# Patient Record
Sex: Female | Born: 1935 | Race: White | Hispanic: No | Smoking: Never smoker
Health system: Southern US, Community
[De-identification: ages and names within clinical notes are randomized; demographics above are authoritative.]

## PROBLEM LIST (undated history)

## (undated) DIAGNOSIS — I872 Venous insufficiency (chronic) (peripheral): Secondary | ICD-10-CM

## (undated) DIAGNOSIS — D649 Anemia, unspecified: Secondary | ICD-10-CM

## (undated) DIAGNOSIS — M5090 Cervical disc disorder, unspecified, unspecified cervical region: Secondary | ICD-10-CM

## (undated) DIAGNOSIS — E079 Disorder of thyroid, unspecified: Secondary | ICD-10-CM

## (undated) DIAGNOSIS — M810 Age-related osteoporosis without current pathological fracture: Secondary | ICD-10-CM

## (undated) DIAGNOSIS — E7800 Pure hypercholesterolemia, unspecified: Secondary | ICD-10-CM

## (undated) DIAGNOSIS — H409 Unspecified glaucoma: Secondary | ICD-10-CM

## (undated) HISTORY — DX: Hypercalcemia: E83.52

## (undated) HISTORY — DX: Disorder of thyroid, unspecified: E07.9

## (undated) HISTORY — DX: Anemia, unspecified: D64.9

## (undated) HISTORY — DX: Venous insufficiency (chronic) (peripheral): I87.2

## (undated) HISTORY — DX: Pure hypercholesterolemia, unspecified: E78.00

## (undated) HISTORY — DX: Unspecified glaucoma: H40.9

## (undated) HISTORY — DX: Cervical disc disorder, unspecified, unspecified cervical region: M50.90

## (undated) HISTORY — DX: Age-related osteoporosis without current pathological fracture: M81.0

---

## 1997-06-25 ENCOUNTER — Ambulatory Visit (HOSPITAL_COMMUNITY): Admission: RE | Admit: 1997-06-25 | Discharge: 1997-06-25 | Payer: Self-pay | Admitting: Internal Medicine

## 1997-07-30 ENCOUNTER — Ambulatory Visit (HOSPITAL_COMMUNITY): Admission: RE | Admit: 1997-07-30 | Discharge: 1997-07-30 | Payer: Self-pay | Admitting: Obstetrics & Gynecology

## 2000-01-05 ENCOUNTER — Encounter: Payer: Self-pay | Admitting: Internal Medicine

## 2000-01-05 ENCOUNTER — Encounter: Admission: RE | Admit: 2000-01-05 | Discharge: 2000-01-05 | Payer: Self-pay | Admitting: Internal Medicine

## 2001-01-21 ENCOUNTER — Encounter: Admission: RE | Admit: 2001-01-21 | Discharge: 2001-01-21 | Payer: Self-pay | Admitting: Internal Medicine

## 2001-01-21 ENCOUNTER — Encounter: Payer: Self-pay | Admitting: Internal Medicine

## 2001-02-01 ENCOUNTER — Ambulatory Visit (HOSPITAL_COMMUNITY): Admission: RE | Admit: 2001-02-01 | Discharge: 2001-02-01 | Payer: Self-pay | Admitting: Internal Medicine

## 2001-02-01 ENCOUNTER — Encounter: Payer: Self-pay | Admitting: Internal Medicine

## 2002-01-21 ENCOUNTER — Other Ambulatory Visit: Admission: RE | Admit: 2002-01-21 | Discharge: 2002-01-21 | Payer: Self-pay | Admitting: Internal Medicine

## 2002-01-23 ENCOUNTER — Encounter: Payer: Self-pay | Admitting: Internal Medicine

## 2002-01-23 ENCOUNTER — Encounter: Admission: RE | Admit: 2002-01-23 | Discharge: 2002-01-23 | Payer: Self-pay | Admitting: Internal Medicine

## 2002-02-03 ENCOUNTER — Encounter: Admission: RE | Admit: 2002-02-03 | Discharge: 2002-02-03 | Payer: Self-pay | Admitting: Internal Medicine

## 2002-02-03 ENCOUNTER — Encounter: Payer: Self-pay | Admitting: Internal Medicine

## 2003-01-26 ENCOUNTER — Encounter: Admission: RE | Admit: 2003-01-26 | Discharge: 2003-01-26 | Payer: Self-pay | Admitting: Internal Medicine

## 2003-01-26 ENCOUNTER — Encounter: Payer: Self-pay | Admitting: Internal Medicine

## 2004-01-28 ENCOUNTER — Encounter: Admission: RE | Admit: 2004-01-28 | Discharge: 2004-01-28 | Payer: Self-pay | Admitting: Internal Medicine

## 2005-02-10 ENCOUNTER — Encounter: Admission: RE | Admit: 2005-02-10 | Discharge: 2005-02-10 | Payer: Self-pay | Admitting: Internal Medicine

## 2005-02-26 ENCOUNTER — Observation Stay (HOSPITAL_COMMUNITY): Admission: EM | Admit: 2005-02-26 | Discharge: 2005-02-28 | Payer: Self-pay | Admitting: Emergency Medicine

## 2005-09-21 ENCOUNTER — Ambulatory Visit (HOSPITAL_COMMUNITY): Admission: RE | Admit: 2005-09-21 | Discharge: 2005-09-21 | Payer: Self-pay | Admitting: Cardiology

## 2005-10-13 ENCOUNTER — Encounter: Admission: RE | Admit: 2005-10-13 | Discharge: 2005-10-13 | Payer: Self-pay | Admitting: Internal Medicine

## 2006-02-12 ENCOUNTER — Encounter: Admission: RE | Admit: 2006-02-12 | Discharge: 2006-02-12 | Payer: Self-pay | Admitting: Internal Medicine

## 2007-02-14 ENCOUNTER — Encounter: Admission: RE | Admit: 2007-02-14 | Discharge: 2007-02-14 | Payer: Self-pay | Admitting: Geriatric Medicine

## 2007-08-21 ENCOUNTER — Emergency Department (HOSPITAL_COMMUNITY): Admission: EM | Admit: 2007-08-21 | Discharge: 2007-08-21 | Payer: Self-pay | Admitting: Emergency Medicine

## 2008-02-18 ENCOUNTER — Encounter: Admission: RE | Admit: 2008-02-18 | Discharge: 2008-02-18 | Payer: Self-pay | Admitting: Geriatric Medicine

## 2009-02-24 ENCOUNTER — Encounter: Admission: RE | Admit: 2009-02-24 | Discharge: 2009-02-24 | Payer: Self-pay | Admitting: Geriatric Medicine

## 2010-02-28 ENCOUNTER — Encounter: Admission: RE | Admit: 2010-02-28 | Discharge: 2010-02-28 | Payer: Self-pay | Admitting: Geriatric Medicine

## 2010-08-19 NOTE — Consult Note (Signed)
NAME:  Roberta [name], Roberta [name]               ACCOUNT NO.:  [id number]   MEDICAL RECORD NO.:  [id number]          PATIENT TYPE:  INP   LOCATION:  2010                         FACILITY:  [facility]   PHYSICIAN:  Armanda Magic, M.D.     DATE OF BIRTH:  1929/10/23   DATE OF CONSULTATION:  02/27/2005  DATE OF DISCHARGE:                                   CONSULTATION   REFERRING PHYSICIAN:  Ladell Pier, M.D.   CHIEF COMPLAINT:  Chest pain.   HISTORY OF PRESENT ILLNESS:  This is a 77 year old white female with no  previous history of coronary disease who presents with a five week history  of atypical chest pain associated with rest and exertion.  She describes it  as located over the left mid chest.  There was no radiation and no  associated symptoms of shortness of breath, nausea or vomiting or  diaphoresis.  She says it usually lasts less than 15 minutes at time.  She  states she walks regularly at least one mile a day.  She had one occasion  where chest pain occurred and the patient rested and the pain resolved but  she has had other times where she has had no chest pain while walking.  She  had chest pain on February 26, 2005 while here at the hospital for a  meeting.  Apparently appeared gray with chest pain and was sent to the  emergency room.  She has ruled out for myocardial infarction by serial  enzymes.  She says the frequency of pain occurs anywhere from 2-3 episodes a  day for the past 3-4 weeks.   PAST MEDICAL HISTORY:  Osteoporosis.   PAST SURGICAL HISTORY:  Cholecystectomy and total abdominal hysterectomy.   SOCIAL HISTORY:  She denies any tobacco or alcohol use.   FAMILY HISTORY:  Her father had an MI in his late 43's.  He died before he  was 41.  Her mother was healthy.   REVIEW OF SYSTEMS:  Otherwise stated in HPI is negative.   ALLERGIES:  None.   CURRENT MEDICATIONS:  Fosamax 70 mg weekly.  Vitamin D with calcium b.i.d.  Multivitamins and Vitamin E.   PHYSICAL  EXAMINATION:  GENERAL:  A well-developed, well-nourished white  female in no acute distress.  VITAL SIGNS:  Benign.  BP 104/66, pulse 74, respirations 18, she is  afebrile.  NECK:  Supple without lymphadenopathy.  Carotid upstroke is +2 bilaterally  without bruits.  LUNGS:  Clear to auscultation throughout.  HEART:  Regular rate and rhythm.  No murmur, rub, or gallop.  Normal S1 and  S2.  ABDOMEN:  Soft, nontender, nondistended, active bowel sounds, no  hepatosplenomegaly.  EXTREMITIES:  No cyanosis, erythema or edema, +1 posterior tibial pulses  bilaterally.   LABORATORY DATA:  Myoglobin 43.5, MB 1, troponin less than 0.05, CPK 44, MB  1.3, troponin 0.01.  Sodium 137, potassium 3.4, chloride 107, bicarbonate  26, BUN 12, creatinine 0.5, glucose 93, LFT's are normal.  Hematocrit 37.7,  hemoglobin 12.7, platelet count 259, white cell count 8.5, PT 12, INR 0.9.  EKG shows  sinus rhythm with no ST-T wave abnormalities.   ASSESSMENT:  Atypical chest pain in a patient with only cardiac risk factors  are age and family history.  Her EKG is nonischemic and cardiac enzymes are  negative.   PLAN:  Would recommend stress Cardiolite study in the morning to rule out  inducible ischemia.  She has already eaten this morning so we will order a  stress Cardiolite for in the morning.  Would recommend also checking a  fasting statin panel if not done in the past 12 months.  Would continue  aspirin and Lovenox for DVT prophylaxis.      Armanda Magic, M.D.  Electronically Signed     TT/MEDQ  D:  02/27/2005  T:  02/27/2005  Job:  [id number]   cc:   Ladell Pier, M.D.  Fax: [phone]

## 2010-08-19 NOTE — Discharge Summary (Signed)
NAME:  Roberta [name], Roberta [name]               ACCOUNT NO.:  [id number]   MEDICAL RECORD NO.:  [id number]          PATIENT TYPE:  INP   LOCATION:  2010                         FACILITY:  [facility]   PHYSICIAN:  Ladell Pier, M.D.   DATE OF BIRTH:  [birth year]   DATE OF ADMISSION:  02/26/2005  DATE OF DISCHARGE:  02/28/2005                                 DISCHARGE SUMMARY   DISCHARGE DIAGNOSES:  1.  Chest pain with negative Cardiolite stress test.  2.  Osteoporosis.   DISCHARGE MEDICATIONS:  1.  Calcium with vitamin D twice daily.  2.  Fosamax 70 mg weekly.  3.  Multivitamins daily.  4.  Aspirin 81 mg daily.   FOLLOW UP:  The patient to follow up in the office in two weeks.   OPERATION/PROCEDURE:  Cardiolite stress test.   CONSULTATIONS:  Cardiology.   HISTORY OF PRESENT ILLNESS:  The patient is a 77 year old white female with  past medical history significant for osteoporosis.  The patient came in with  five days of three to four episodes of chest pain.  Pain was located in the  left side of the chest without radiation or any associated symptoms.  The  patient presented to the emergency room for further evaluation.   Past medical history, medications, allergies, review of systems per  admission history and physical.   PHYSICAL EXAMINATION ON DISCHARGE:  VITAL SIGNS: Temperature 97.5, pulse 78,  respirations 20, blood pressure 93/55, pulse oximetry 98% on room air.  HEENT:  Normocephalic, atraumatic.  Pupils are equal, round and reactive to  light.  Throat without erythema.  CARDIOVASCULAR:  Regular rate and rhythm.  ABDOMEN:  Positive bowel sounds.  EXTREMITIES:  Without edema.   HOSPITAL COURSE:  1.  Chest pain.  The patient was admitted, placed on aspirin. Cardiology      consulted and she had a Cardiolite stress test done that was normal.  2.  Osteoporosis.  Her Fosamax was held while she was an inpatient.   DISCHARGE LABORATORY DATA:  WBC 7.8, hemoglobin 11.8, platelets 238, MCV  90.2.  Sodium 140, potassium 3.7, chloride 110, glucose 85, BUN 14, calcium  10.4, creatinine 0.9.  Cardiac enzymes negative x3.  Total cholesterol 329.  LDL 144.  HDL 66. Triglycerides 95.      Ladell Pier, M.D.  Electronically Signed     NJ/MEDQ  D:  04/21/2005  T:  04/21/2005  Job:  [id number]

## 2010-08-19 NOTE — H&P (Signed)
NAME:  Roberta [name], Roberta [name] NO.:  [id number]   MEDICAL RECORD NO.:  [id number]          PATIENT TYPE:  INP   LOCATION:  2010                         FACILITY:  [facility]   PHYSICIAN:  Theressa Millard, M.D.    DATE OF BIRTH:  May 12, 1929   DATE OF ADMISSION:  02/26/2005  DATE OF DISCHARGE:                                HISTORY & PHYSICAL   HISTORY OF PRESENT ILLNESS:  Roberta [name] is brought in for observation because  of chest pain.   For the past 5 weeks, she has 3-4 episodes of chest discomfort per day,  usually occurring about 3 days per week.  The chest pain is a left upper  chest discomfort without radiation or any associated symptoms.  Today, she  had 2 spells while she was in the hospital at a meeting.  She looked  somewhat gray and was brought to the emergency department.  She rated the  pain as a 3-4/10.   In terms of cardiac risk factors, she has a positive family history, but no  history of hypertension, diabetes, or hypercholesterolemia.  She does not  smoke.   PAST MEDICAL HISTORY:   SURGICAL:  1.  Cholecystectomy.  2.  Hysterectomy for benign reasons.   MEDICAL:  Osteoporosis.  Otherwise, negative.   MEDICATIONS:  1.  Fosamax 70 mg weekly.  2.  Vitamin D plus calcium tablets twice daily.  3.  Multivitamin daily.  4.  Vitamin E daily.   SOCIAL HISTORY:  Does not smoke.  Does not consume alcohol.  She is widowed.  She lives alone.   FAMILY HISTORY:  Father died of heart disease that started at less than age  30.   REVIEW OF SYSTEMS:  Otherwise negative.   PHYSICAL EXAMINATION:  VITAL SIGNS:  Blood pressure 145/85, pulse 77.  GENERAL:  She is comfortable.  SKIN:  Warm and dry.  HEENT:  Pupils equal, round and reactive to light.  Extraocular movements  are intact.  Funduscopic exam is unremarkable.  Ears, nose, and throat are  unremarkable.  NECK:  Supple.  Thyroid is not enlarged and tender.  CHEST:  Clear to auscultation and percussion.  CARDIAC:  Normal S1 and S2 without an S3, S4, murmur, rub, or click.  ABDOMEN:  Soft and nontender with normal bowel sounds without  hepatosplenomegaly or mass.  EXTREMITIES:  Without clubbing, cyanosis, or edema.   LABORATORY DATA:  Hemoglobin 12.7, white count 8500.  Electrolytes normal.  Troponin negative.  Myoglobin negative.   EKG shows normal sinus rhythm with a normal EKG.   IMPRESSION:  Chest discomfort.  This is certainly atypical for coronary  artery disease, but could be unstable angina.  Will place her on observation  status and ask for a cardiology consult in the morning.      Theressa Millard, M.D.  Electronically Signed     JO/MEDQ  D:  02/26/2005  T:  02/27/2005  Job:  [id number]

## 2010-08-19 NOTE — Discharge Summary (Signed)
NAME:  Roberta [name], Roberta [name]               ACCOUNT NO.:  [id number]   MEDICAL RECORD NO.:  [id number]          PATIENT TYPE:  INP   LOCATION:  2010                         FACILITY:  [facility]   PHYSICIAN:  Ladell Pier, M.D.   DATE OF BIRTH:  07-Jan-1930   DATE OF ADMISSION:  02/26/2005  DATE OF DISCHARGE:  02/28/2005                                 DISCHARGE SUMMARY   DISCHARGE DIAGNOSES:  1.  Chest pain and negative Cardiolite stress tests, status post      cholecystectomy.  2.  Hysterectomy for benign reasons.  3.  Osteoporosis.   DISCHARGE MEDICATIONS:  1.  Fosamax 70 mg weekly.  2.  Vitamin D Plus Calcium twice daily.  3.  Multivitamin daily.  4.  Vitamin E daily.   CONSULTATIONS:  Cardiology, [place], Dr. Mayford Knife.   PROCEDURES:  Sets of Cardiolites.  The first test was negative.   FOLLOW UP:  He is in follow-up with Dr. Olena Leatherwood in two weeks.   HISTORY OF PRESENT ILLNESS:  The patient is a female with no significant  past medical history except for osteoporosis and a family history of heart  disease that presented to the emergency room with chest pain that has been  going on for the past five weeks.  She was admitted for unstable angina for  further workup.   Past medical history, family history, social history, medications,  allergies, and review of systems per admission H&P.   PHYSICAL EXAMINATION:  VITAL SIGNS:  Temperature 97, pulse 75, respirations  30, blood pressure 110/60, pulse oximetry 60% on room.  HEENT:  Head is normocephalic, atraumatic.  Pupils equal, round, and  reactive to light,  Throat without erythema.  CARDIOVASCULAR:  Regular rate and rhythm.  LUNGS:  Clear bilaterally.  ABDOMEN:  Positive bowel sounds.  EXTREMITIES:  Without edema.   HOSPITAL COURSE:  1.  Underlying chest pain.  The patient was admitted to the telemetry bed.      Cardiac enzymes were all negative.  EKG had no acute ST segment      elevation or depression.  She had a profusion  Cardiolite stress test      done that showed normal myocardial profusion without evidence of infarct      and she had normal ejection fraction estimated at 74%.  The patient was      discharged with follow-up in the office.  Our chest __________is      noncardiac and most likely noncardiac in etiology.   LABORATORY DATA:  Discharge labs:  Total cholesterol 229, triglycerides 95,  HDL 66, LDL 144.  BMP:  Sodium 140, potassium 4.1, chloride 109, CO2 24,  glucose 78, BUN 21, creatinine 0.9, calcium 10.4. CBC:  WBC 7.8, hemoglobin  11.8, MCV 90, platelets 238.  See this also at the discharge.  Problem with  mild anemia.      Ladell Pier, M.D.  Electronically Signed     NJ/MEDQ  D:  03/01/2005  T:  03/01/2005  Job:  [id number]

## 2010-08-19 NOTE — Cardiovascular Report (Signed)
NAME:  Roberta [name], Roberta [name]               ACCOUNT NO.:  [id number]   MEDICAL RECORD NO.:  [id number]          PATIENT TYPE:  OIB   LOCATION:  2853                         FACILITY:  [facility]   PHYSICIAN:  Armanda Magic, M.D.     DATE OF BIRTH:  January 28, 1930   DATE OF PROCEDURE:  09/21/2005  DATE OF DISCHARGE:                              CARDIAC CATHETERIZATION   REFERRING PHYSICIAN:  Dr. Olena Leatherwood.   PROCEDURE:  Left heart catheterization, coronary angiography, left  ventriculography.   OPERATOR:  Armanda Magic, M.D.   INDICATIONS:  Chest pain.   COMPLICATIONS:  None.   INTRAVENOUS ACCESS:  Via right femoral artery 6-French sheath.   HISTORY:  This is a 77 year old white female who has a history of chest pain  and underwent stress Cardiolite study in November which showed no inducible  ischemia but recently has developed more chest pain, intermittent with  exertion and now presents for cardiac catheterization.   The patient was brought to cardiac catheterization laboratory in a fasting  nonsedated state.  Informed consent was obtained.  The patient was connected  to continuous heart rate and pulse oximetry monitoring and intermittent  blood pressure monitoring.  The right groin was prepped and draped in a  sterile fashion.  One percent Xylocaine was used for local anesthesia.  Using a modified Seldinger technique, a 6-French sheath was placed in the  right femoral artery.  Under fluoroscopic guidance, a 6-French JL-4 catheter  was placed in the left coronary artery.  Multiple cine films were taken in  30-degree RAO and 40-degree LAO views.  Catheter was exchanged over a  guidewire for a 6-French JR-4 catheter which was placed under fluoroscopic  guidance in the right coronary artery.  Multiple cine films were taken in  the 30-degree RAO and 40-degree LAO views.  This catheter was then exchanged  out over a guidewire for a 6-French angled pigtail catheter which was placed  under  fluoroscopic guidance in the left ventricular cavity.  Left  ventriculography was performed in 30-degree RAO view using a total of 30 mL  of contrast at 15 mL/second.  The catheter was then pulled back across the  aortic valve with no significant gradient noted.  At the end of procedure,  all catheters and sheaths were removed.  Manual compression was performed  until adequate hemostasis was obtained.  The patient was transferred back to  room in stable condition.   RESULTS:  Left main coronary is widely patent and trifurcates into a left  anterior descending artery, ramus branch and left circumflex.  Left anterior  descending artery is widely patent in its proximal portion giving rise two  diagonal branches, both of which are widely patent and both of which  bifurcated into daughter vessels.  Just distal to the takeoff of the second  diagonal, there was a 40-50% mid LAD narrowing.  The LAD then traverses the  apex and is widely patent throughout the rest of its course.   The ramus is a moderate sized vessel and is widely patent.   The left circumflex is widely patent throughout its  course in the AV groove  giving rise to a very small obtuse marginal 1 branch and then a large obtuse  marginal 2 branch which is widely patent.   The right coronary is widely patent throughout its course and bifurcates  distally into posterior lateral artery and posterior descending artery.  Posterior descending artery is widely patent.  The posterior lateral artery  has a proximal 20-30% narrowing.   Left ventriculography shows normal LV systolic function, EF 65%.  Left  ventricular pressure was 133/5 mmHg, aortic pressure 140/73 mmHg.   ASSESSMENT:  1.  Noncardiac chest pain.  2.  Nonobstructive coronary disease with borderline mid left anterior      descending artery.  3.  Normal left ventricular function.   PLAN:  1.  Discharge home after IV fluid and bedrest.  2.  Outpatient adenosine  Cardiolite to rule out anterior ischemia.  Her last      Cardiolite was more than 6 months ago.  3.  Follow up with me in 2 to 3 weeks.      Armanda Magic, M.D.  Electronically Signed     TT/MEDQ  D:  09/21/2005  T:  09/21/2005  Job:  [id number]   cc:   Olena Leatherwood, M.D.

## 2011-01-24 ENCOUNTER — Other Ambulatory Visit: Payer: Self-pay | Admitting: Geriatric Medicine

## 2011-01-24 DIAGNOSIS — Z1231 Encounter for screening mammogram for malignant neoplasm of breast: Secondary | ICD-10-CM

## 2011-03-02 ENCOUNTER — Ambulatory Visit: Payer: Self-pay

## 2011-03-02 ENCOUNTER — Ambulatory Visit
Admission: RE | Admit: 2011-03-02 | Discharge: 2011-03-02 | Disposition: A | Discharge status: Home / Self Care | Payer: Medicare Other | Source: Ambulatory Visit | Attending: Geriatric Medicine | Admitting: Geriatric Medicine

## 2011-03-02 DIAGNOSIS — Z1231 Encounter for screening mammogram for malignant neoplasm of breast: Secondary | ICD-10-CM

## 2011-09-26 ENCOUNTER — Other Ambulatory Visit: Payer: Self-pay | Admitting: Gastroenterology

## 2011-09-26 DIAGNOSIS — D649 Anemia, unspecified: Secondary | ICD-10-CM

## 2011-09-26 DIAGNOSIS — K5790 Diverticulosis of intestine, part unspecified, without perforation or abscess without bleeding: Secondary | ICD-10-CM

## 2011-09-27 ENCOUNTER — Ambulatory Visit
Admission: RE | Admit: 2011-09-27 | Discharge: 2011-09-27 | Disposition: A | Discharge status: Home / Self Care | Payer: Medicare Other | Source: Ambulatory Visit | Attending: Gastroenterology | Admitting: Gastroenterology

## 2011-09-27 DIAGNOSIS — D649 Anemia, unspecified: Secondary | ICD-10-CM

## 2011-09-27 DIAGNOSIS — K5790 Diverticulosis of intestine, part unspecified, without perforation or abscess without bleeding: Secondary | ICD-10-CM

## 2012-01-22 ENCOUNTER — Other Ambulatory Visit: Payer: Self-pay | Admitting: Geriatric Medicine

## 2012-01-22 DIAGNOSIS — Z1231 Encounter for screening mammogram for malignant neoplasm of breast: Secondary | ICD-10-CM

## 2012-03-04 ENCOUNTER — Ambulatory Visit
Admission: RE | Admit: 2012-03-04 | Discharge: 2012-03-04 | Disposition: A | Discharge status: Home / Self Care | Payer: Medicare Other | Source: Ambulatory Visit | Attending: Geriatric Medicine | Admitting: Geriatric Medicine

## 2012-03-04 DIAGNOSIS — Z1231 Encounter for screening mammogram for malignant neoplasm of breast: Secondary | ICD-10-CM

## 2013-01-28 ENCOUNTER — Other Ambulatory Visit: Payer: Self-pay

## 2013-01-28 DIAGNOSIS — Z1231 Encounter for screening mammogram for malignant neoplasm of breast: Secondary | ICD-10-CM

## 2013-03-06 ENCOUNTER — Ambulatory Visit
Admission: RE | Admit: 2013-03-06 | Discharge: 2013-03-06 | Disposition: A | Discharge status: Home / Self Care | Payer: Medicare Other | Source: Ambulatory Visit

## 2013-03-06 DIAGNOSIS — Z1231 Encounter for screening mammogram for malignant neoplasm of breast: Secondary | ICD-10-CM

## 2013-05-04 DIAGNOSIS — M810 Age-related osteoporosis without current pathological fracture: Secondary | ICD-10-CM

## 2013-05-04 HISTORY — DX: Age-related osteoporosis without current pathological fracture: M81.0

## 2014-01-01 ENCOUNTER — Encounter: Payer: Self-pay | Admitting: General Surgery

## 2014-01-01 DIAGNOSIS — E7800 Pure hypercholesterolemia, unspecified: Secondary | ICD-10-CM | POA: Insufficient documentation

## 2014-02-06 ENCOUNTER — Other Ambulatory Visit: Payer: Self-pay

## 2014-02-06 DIAGNOSIS — Z1231 Encounter for screening mammogram for malignant neoplasm of breast: Secondary | ICD-10-CM

## 2014-03-09 ENCOUNTER — Ambulatory Visit: Payer: Medicare Other

## 2014-03-12 ENCOUNTER — Ambulatory Visit
Admission: RE | Admit: 2014-03-12 | Discharge: 2014-03-12 | Disposition: A | Discharge status: Home / Self Care | Payer: Medicare Other | Source: Ambulatory Visit

## 2014-03-12 DIAGNOSIS — Z1231 Encounter for screening mammogram for malignant neoplasm of breast: Secondary | ICD-10-CM

## 2015-02-09 ENCOUNTER — Other Ambulatory Visit: Payer: Self-pay

## 2015-02-09 DIAGNOSIS — Z1231 Encounter for screening mammogram for malignant neoplasm of breast: Secondary | ICD-10-CM

## 2015-03-15 ENCOUNTER — Ambulatory Visit: Payer: Self-pay

## 2015-03-18 ENCOUNTER — Ambulatory Visit
Admission: RE | Admit: 2015-03-18 | Discharge: 2015-03-18 | Disposition: A | Discharge status: Home / Self Care | Payer: Medicare Other | Source: Ambulatory Visit

## 2015-03-18 DIAGNOSIS — Z1231 Encounter for screening mammogram for malignant neoplasm of breast: Secondary | ICD-10-CM

## 2015-03-31 ENCOUNTER — Ambulatory Visit: Payer: Self-pay

## 2015-11-15 ENCOUNTER — Other Ambulatory Visit: Payer: Self-pay | Admitting: Geriatric Medicine

## 2015-11-15 DIAGNOSIS — M79662 Pain in left lower leg: Secondary | ICD-10-CM

## 2015-11-18 ENCOUNTER — Ambulatory Visit
Admission: RE | Admit: 2015-11-18 | Discharge: 2015-11-18 | Disposition: A | Discharge status: Home / Self Care | Payer: Medicare Other | Source: Ambulatory Visit | Attending: Geriatric Medicine | Admitting: Geriatric Medicine

## 2015-11-18 DIAGNOSIS — M79662 Pain in left lower leg: Secondary | ICD-10-CM

## 2015-11-24 ENCOUNTER — Other Ambulatory Visit: Payer: Self-pay | Admitting: Geriatric Medicine

## 2015-11-24 DIAGNOSIS — I82612 Acute embolism and thrombosis of superficial veins of left upper extremity: Secondary | ICD-10-CM

## 2015-11-29 ENCOUNTER — Ambulatory Visit
Admission: RE | Admit: 2015-11-29 | Discharge: 2015-11-29 | Disposition: A | Discharge status: Home / Self Care | Payer: Medicare Other | Source: Ambulatory Visit | Attending: Geriatric Medicine | Admitting: Geriatric Medicine

## 2015-11-29 ENCOUNTER — Other Ambulatory Visit: Payer: Medicare Other

## 2015-11-29 DIAGNOSIS — I82612 Acute embolism and thrombosis of superficial veins of left upper extremity: Secondary | ICD-10-CM

## 2016-02-17 ENCOUNTER — Other Ambulatory Visit: Payer: Self-pay | Admitting: Geriatric Medicine

## 2016-02-17 DIAGNOSIS — Z1231 Encounter for screening mammogram for malignant neoplasm of breast: Secondary | ICD-10-CM

## 2016-03-20 ENCOUNTER — Ambulatory Visit
Admission: RE | Admit: 2016-03-20 | Discharge: 2016-03-20 | Disposition: A | Discharge status: Home / Self Care | Payer: Medicare Other | Source: Ambulatory Visit | Attending: Geriatric Medicine | Admitting: Geriatric Medicine

## 2016-03-20 DIAGNOSIS — Z1231 Encounter for screening mammogram for malignant neoplasm of breast: Secondary | ICD-10-CM

## 2017-02-08 ENCOUNTER — Other Ambulatory Visit: Payer: Self-pay | Admitting: Geriatric Medicine

## 2017-02-08 DIAGNOSIS — Z1231 Encounter for screening mammogram for malignant neoplasm of breast: Secondary | ICD-10-CM

## 2017-03-21 ENCOUNTER — Ambulatory Visit
Admission: RE | Admit: 2017-03-21 | Discharge: 2017-03-21 | Disposition: A | Discharge status: Home / Self Care | Payer: Medicare Other | Source: Ambulatory Visit | Attending: Geriatric Medicine | Admitting: Geriatric Medicine

## 2017-03-21 DIAGNOSIS — Z1231 Encounter for screening mammogram for malignant neoplasm of breast: Secondary | ICD-10-CM

## 2018-02-08 ENCOUNTER — Other Ambulatory Visit: Payer: Self-pay | Admitting: Geriatric Medicine

## 2018-02-08 DIAGNOSIS — Z1231 Encounter for screening mammogram for malignant neoplasm of breast: Secondary | ICD-10-CM

## 2018-03-22 ENCOUNTER — Ambulatory Visit
Admission: RE | Admit: 2018-03-22 | Discharge: 2018-03-22 | Disposition: A | Discharge status: Home / Self Care | Payer: Medicare Other | Source: Ambulatory Visit | Attending: Geriatric Medicine | Admitting: Geriatric Medicine

## 2018-03-22 DIAGNOSIS — Z1231 Encounter for screening mammogram for malignant neoplasm of breast: Secondary | ICD-10-CM

## 2018-05-27 DIAGNOSIS — M810 Age-related osteoporosis without current pathological fracture: Secondary | ICD-10-CM | POA: Diagnosis not present

## 2018-07-31 DIAGNOSIS — G8929 Other chronic pain: Secondary | ICD-10-CM | POA: Diagnosis not present

## 2018-07-31 DIAGNOSIS — E213 Hyperparathyroidism, unspecified: Secondary | ICD-10-CM | POA: Diagnosis not present

## 2018-07-31 DIAGNOSIS — M25562 Pain in left knee: Secondary | ICD-10-CM | POA: Diagnosis not present

## 2018-07-31 DIAGNOSIS — M25561 Pain in right knee: Secondary | ICD-10-CM | POA: Diagnosis not present

## 2018-08-29 ENCOUNTER — Other Ambulatory Visit: Payer: Self-pay | Admitting: *Deleted

## 2018-08-29 NOTE — Patient Outreach (Signed)
Late entry- 07/01/18  Telephone call to patient for follow up on [employer] Member Health Questionnaire.  Spoke with pt, HIPAA verified, explained program, pt states she has no needs, per [employer] pt to be placed into engagement program.  [facility] mailed welcome letter to pt home with 24 hour nurse line magnet and pamphlet.  PLAN Outreach pt in 6 months  Irving Shows [facility], BSN [facility] Care Coordinator [phone]

## 2018-09-25 ENCOUNTER — Other Ambulatory Visit: Payer: Self-pay | Admitting: *Deleted

## 2018-09-25 NOTE — Patient Outreach (Signed)
Case closed, pt enrolled in external program [employer], RN CM faxed case closure letter to primary care provider.  Case closed  Tawn Fitzner RNC, BSN [facility] Care Coordinator [phone]   

## 2018-10-15 DIAGNOSIS — Z79899 Other long term (current) drug therapy: Secondary | ICD-10-CM | POA: Diagnosis not present

## 2018-10-15 DIAGNOSIS — R269 Unspecified abnormalities of gait and mobility: Secondary | ICD-10-CM | POA: Diagnosis not present

## 2018-10-15 DIAGNOSIS — M810 Age-related osteoporosis without current pathological fracture: Secondary | ICD-10-CM | POA: Diagnosis not present

## 2018-10-15 DIAGNOSIS — Z0000 Encounter for general adult medical examination without abnormal findings: Secondary | ICD-10-CM | POA: Diagnosis not present

## 2018-10-15 DIAGNOSIS — Z1389 Encounter for screening for other disorder: Secondary | ICD-10-CM | POA: Diagnosis not present

## 2018-10-15 DIAGNOSIS — E7800 Pure hypercholesterolemia, unspecified: Secondary | ICD-10-CM | POA: Diagnosis not present

## 2018-10-15 DIAGNOSIS — E213 Hyperparathyroidism, unspecified: Secondary | ICD-10-CM | POA: Diagnosis not present

## 2018-11-14 DIAGNOSIS — M810 Age-related osteoporosis without current pathological fracture: Secondary | ICD-10-CM | POA: Diagnosis not present

## 2018-11-14 DIAGNOSIS — D649 Anemia, unspecified: Secondary | ICD-10-CM | POA: Diagnosis not present

## 2019-01-01 ENCOUNTER — Ambulatory Visit: Payer: Medicare Other | Admitting: *Deleted

## 2019-01-29 DIAGNOSIS — M79605 Pain in left leg: Secondary | ICD-10-CM | POA: Diagnosis not present

## 2019-02-11 ENCOUNTER — Other Ambulatory Visit: Payer: Self-pay | Admitting: Geriatric Medicine

## 2019-02-11 DIAGNOSIS — Z1231 Encounter for screening mammogram for malignant neoplasm of breast: Secondary | ICD-10-CM

## 2019-04-03 ENCOUNTER — Ambulatory Visit: Payer: Medicare Other

## 2019-04-09 ENCOUNTER — Other Ambulatory Visit: Payer: Self-pay

## 2019-04-09 ENCOUNTER — Ambulatory Visit
Admission: RE | Admit: 2019-04-09 | Discharge: 2019-04-09 | Disposition: A | Discharge status: Home / Self Care | Payer: PPO | Source: Ambulatory Visit | Attending: Geriatric Medicine | Admitting: Geriatric Medicine

## 2019-04-09 DIAGNOSIS — Z1231 Encounter for screening mammogram for malignant neoplasm of breast: Secondary | ICD-10-CM | POA: Diagnosis not present

## 2019-04-24 ENCOUNTER — Ambulatory Visit: Payer: PPO | Attending: Internal Medicine

## 2019-04-24 DIAGNOSIS — Z23 Encounter for immunization: Secondary | ICD-10-CM

## 2019-04-24 NOTE — Progress Notes (Signed)
   Covid-19 Vaccination Clinic  Name:  Roberta [name]    MRN: [id number] DOB: 05-07-1929  04/24/2019  Roberta [name] was observed post Covid-19 immunization for 15 minutes without incidence. She was provided with Vaccine Information Sheet and instruction to access the V-Safe system.   Roberta [name] was instructed to call 911 with any severe reactions post vaccine: Marland Kitchen Difficulty breathing  . Swelling of your face and throat  . A fast heartbeat  . A bad rash all over your body  . Dizziness and weakness    Immunizations Administered    Name Date Dose VIS Date Route   Pfizer COVID-19 Vaccine 04/24/2019  2:53 PM 0.3 mL 03/14/2019 Intramuscular   Manufacturer: [employer], [employer]   Lot: [id number]   NDC: [address]-2

## 2019-05-15 ENCOUNTER — Ambulatory Visit: Payer: PPO | Attending: Internal Medicine

## 2019-05-15 DIAGNOSIS — Z23 Encounter for immunization: Secondary | ICD-10-CM | POA: Insufficient documentation

## 2019-05-15 NOTE — Progress Notes (Signed)
   Covid-19 Vaccination Clinic  Name:  JANAISHA TOLSMA    MRN: [id number] DOB: 07-23-1929  05/15/2019  Ms. Mudgett was observed post Covid-19 immunization for 15 minutes without incidence. She was provided with Vaccine Information Sheet and instruction to access the V-Safe system.   Ms. Stenseth was instructed to call 911 with any severe reactions post vaccine: Marland Kitchen Difficulty breathing  . Swelling of your face and throat  . A fast heartbeat  . A bad rash all over your body  . Dizziness and weakness    Immunizations Administered    Name Date Dose VIS Date Route   Pfizer COVID-19 Vaccine 05/15/2019  4:36 PM 0.3 mL 03/14/2019 Intramuscular   Manufacturer: [employer], [employer]   Lot: [id number]   NDC: [address]-7

## 2019-05-28 DIAGNOSIS — M810 Age-related osteoporosis without current pathological fracture: Secondary | ICD-10-CM | POA: Diagnosis not present

## 2019-10-17 DIAGNOSIS — Z79899 Other long term (current) drug therapy: Secondary | ICD-10-CM | POA: Diagnosis not present

## 2019-10-17 DIAGNOSIS — Z1389 Encounter for screening for other disorder: Secondary | ICD-10-CM | POA: Diagnosis not present

## 2019-10-17 DIAGNOSIS — M810 Age-related osteoporosis without current pathological fracture: Secondary | ICD-10-CM | POA: Diagnosis not present

## 2019-10-17 DIAGNOSIS — E213 Hyperparathyroidism, unspecified: Secondary | ICD-10-CM | POA: Diagnosis not present

## 2019-10-17 DIAGNOSIS — E7800 Pure hypercholesterolemia, unspecified: Secondary | ICD-10-CM | POA: Diagnosis not present

## 2019-10-17 DIAGNOSIS — Z0000 Encounter for general adult medical examination without abnormal findings: Secondary | ICD-10-CM | POA: Diagnosis not present

## 2019-11-21 DIAGNOSIS — M810 Age-related osteoporosis without current pathological fracture: Secondary | ICD-10-CM | POA: Diagnosis not present

## 2020-02-12 DIAGNOSIS — H26492 Other secondary cataract, left eye: Secondary | ICD-10-CM | POA: Diagnosis not present

## 2020-02-12 DIAGNOSIS — H35373 Puckering of macula, bilateral: Secondary | ICD-10-CM | POA: Diagnosis not present

## 2020-03-05 DIAGNOSIS — M7062 Trochanteric bursitis, left hip: Secondary | ICD-10-CM | POA: Diagnosis not present

## 2020-03-09 DIAGNOSIS — M25552 Pain in left hip: Secondary | ICD-10-CM | POA: Diagnosis not present

## 2020-03-17 DIAGNOSIS — R42 Dizziness and giddiness: Secondary | ICD-10-CM | POA: Diagnosis not present

## 2020-03-19 DIAGNOSIS — H8110 Benign paroxysmal vertigo, unspecified ear: Secondary | ICD-10-CM | POA: Diagnosis not present

## 2020-03-24 ENCOUNTER — Other Ambulatory Visit: Payer: Self-pay

## 2020-03-24 ENCOUNTER — Ambulatory Visit: Payer: PPO | Attending: Geriatric Medicine

## 2020-03-24 VITALS — BP 136/74

## 2020-03-24 DIAGNOSIS — R42 Dizziness and giddiness: Secondary | ICD-10-CM | POA: Insufficient documentation

## 2020-03-24 DIAGNOSIS — H8112 Benign paroxysmal vertigo, left ear: Secondary | ICD-10-CM | POA: Diagnosis not present

## 2020-03-24 NOTE — Therapy (Signed)
[facility] [address] [place], [place], [address] Phone: [phone]   Fax:  [phone]  Physical Therapy Evaluation  Patient Details  Name: Roberta [name] MRN: [id number] Date of Birth: Apr 26, 1929 Referring Provider (PT): [name], MD   Encounter Date: 03/24/2020   PT End of Session - 03/24/20 1228    Visit Number 1    Number of Visits 2    Date for PT Re-Evaluation 32/35/57   re-cert at follow up visit   Authorization Type HTA    PT Start Time 0932    PT Stop Time 1015    PT Time Calculation (min) 43 min    Activity Tolerance Patient tolerated treatment well    Behavior During Therapy [facility] for tasks assessed/performed           Past Medical History:  Diagnosis Date  . Anemia   . Cervical disc disease   . Glaucoma   . Hypercalcemia   . Hypercholesteremia   . Osteoporosis 2/15   Started Fosamax  . Thyroid disease    Hyperparathyroid  . Venous insufficiency     History reviewed. No pertinent surgical history.  Vitals:   03/24/20 1326  BP: 136/74      Subjective Assessment - 03/24/20 0936    Subjective Patient reports that the dizziness started approx two weeks ago, reports she stood up and fell backwards into the couch about 3 times. Patient repotrs few other episodes, but no episodes since starting the meclizine. Reports no dizziness laying back in bed. Patient reports that has dizziness when looking downward. Patient using a SBQC with ambulation.    Patient is accompained by: Family member   Daughter   Pertinent History Osteoporosis, Anemia, Glaucoma, Hypercholesteremia, Thyroid Disease, Venous Insufficiency    Limitations Walking    Patient Stated Goals Get rid of dizziness; get back how she was prior    Currently in Pain? No/denies              [facility] PT Assessment - 03/24/20 0001      Assessment   Medical Diagnosis BPPV/Dizziness    Referring Provider (PT) [name], MD     Hand Dominance Right      Precautions   Precautions Fall      Balance Screen   Has the patient fallen in the past 6 months No    Has the patient had a decrease in activity level because of a fear of falling?  No    Is the patient reluctant to leave their home because of a fear of falling?  No      Home Environment   Living Environment Private residence    Living Arrangements Alone    Available Help at Discharge Family    Type of Home Apartment    Home Access Level entry    Home Layout One level    Home Equipment Carman - quad      Prior Function   Level of Independence Independent    Vocation Retired      IT consultant   Overall Cognitive Status Within Functional Limits for tasks assessed      Observation/Other Assessments   Focus on Therapeutic Outcomes (FOTO)  53    Other Surveys  Dizziness Handicap Inventory (DHI)    Dizziness Handicap Inventory (DHI)  20/100      Sensation   Light Touch Appears Intact      Transfers   Transfers Sit to Stand;Stand to Sit  Sit to Stand 6: Modified independent (Device/Increase time)    Stand to Sit 6: Modified independent (Device/Increase time)      Ambulation/Gait   Ambulation/Gait Yes    Ambulation/Gait Assistance 5: Supervision    Ambulation/Gait Assistance Details ambulation into/out of therapy session with [facility]    Ambulation Distance (Feet) --   clinic distance   Assistive device Small based quad cane    Ambulation Surface Level;Indoor                  Vestibular Assessment - 03/24/20 0001      Symptom Behavior   Subjective history of current problem patient reports approx two weeks ago increased dizziness when went to get off couch, fell back into couch. Reports spinning sensation. has not had many episodes since beginning meclizine. Did take meclizine this morning.    Type of Dizziness  Spinning;Lightheadedness    Frequency of Dizziness with specific movements (look up/down)    Duration of Dizziness few minutes     Symptom Nature Positional;Motion provoked    Aggravating Factors Forward bending    Relieving Factors Rest    Progression of Symptoms Better      Oculomotor Exam   Oculomotor Alignment Normal    Ocular ROM WNL    Spontaneous Absent    Gaze-induced  Left beating nystagmus with L gaze    Smooth Pursuits Intact    Saccades Intact      Vestibulo-Ocular Reflex   VOR 1 Head Only (x 1 viewing) no dizziness    VOR to Slow Head Movement Normal      Positional Testing   Dix-Hallpike Dix-Hallpike Right;Dix-Hallpike Left      Dix-Hallpike Right   Dix-Hallpike Right Duration None    Dix-Hallpike Right Symptoms No nystagmus      Dix-Hallpike Left   Dix-Hallpike Left Duration 15 secs    Dix-Hallpike Left Symptoms Upbeat, left rotatory nystagmus              Objective measurements completed on examination: See above findings.        Vestibular Treatment/Exercise - 03/24/20 0001      Vestibular Treatment/Exercise   Vestibular Treatment Provided Canalith Repositioning    Canalith Repositioning Epley Manuever Left       EPLEY MANUEVER LEFT   Number of Reps  2    Overall Response  Symptoms Resolved     RESPONSE DETAILS LEFT resolved symptoms after 2 reps                 PT Education - 03/24/20 1228    Education Details Educated on BPPV; POC/Evaluation Findings    Person(s) Educated Patient;Child(ren)    Methods Explanation    Comprehension Verbalized understanding            PT Short Term Goals - 03/24/20 1323      PT SHORT TERM GOAL #1   Title = LTGs             PT Long Term Goals - 03/24/20 1324      PT LONG TERM GOAL #1   Title Patient will improve DHI to </= 10 to demonstrate improvements in symptoms and improve QoL    Baseline 20/100    Time 2    Period Weeks    Status New    Target Date 04/07/20      PT LONG TERM GOAL #2   Title Patient will demosntrate no symptoms with positional testing or bed mobility to demonstrate  resolution of  BPPV    Baseline nystagmus/vertigo with L Gilberto Better    Time 2    Period Weeks    Status New    Target Date 04/07/20                  Plan - 03/24/20 1326    Clinical Impression Statement Patient is a 86 y.o. female that was referred to Neuro OPPT service for Vertigo/BPPV. Patient's PMH significant for the following: Osteoporosis, Anemia, Glaucoma, Hypercholesteremia, Thyroid Disease, Venous Insufficiency. Upon evaluation patient presents with vertigo and L Upbeating Rotary Nystagmus  of short duration with L Dix Hallpike indicative of L Posterior Canal Canalithasis. Completed epley x 2 reps with resolution of symptoms and nytagmus after completion. Patient scored 20/100 on DHI. PT educated patient and daughter on BPPV diagnosis. Patient reports feeling better at end of session. Follow up visit scheduled if needed to ensure resolution of L BPPV. Patient will benefit from skilled PT services to progress toward LTG and return to PLOF.    Personal Factors and Comorbidities Comorbidity 3+;Age    Comorbidities Osteoporosis, Anemia, Glaucoma, Hypercholesteremia, Thyroid Disease, Venous Insufficiency    Examination-Activity Limitations Bed Mobility    Examination-Participation Restrictions Driving;Community Activity    Stability/Clinical Decision Making Stable/Uncomplicated    Clinical Decision Making Low    PT Frequency --   follow up visit   PT Duration --   follow up visit   PT Treatment/Interventions Canalith Repostioning;ADLs/Self Care Home Management;DME Instruction;Stair training;Functional mobility training;Therapeutic activities;Therapeutic exercise;Gait training;Balance training;Neuromuscular re-education;Patient/family education;Vestibular;Passive range of motion    PT Next Visit Plan Reassess L BPPV (Posterior canal Canalithiasis)    Consulted and Agree with Plan of Care Patient;Family member/caregiver    Family Member Consulted Daughter           Patient will benefit from  skilled therapeutic intervention in order to improve the following deficits and impairments:  Dizziness,Decreased balance  Visit Diagnosis: Dizziness and giddiness  BPPV (benign paroxysmal positional vertigo), left     Problem List Patient Active Problem List   Diagnosis Date Noted  . Hypercholesteremia 01/01/2014    Tempie Donning, PT, DPT 03/24/2020, 1:31 PM  [facility] [address] [place], [place], [address] Phone: [phone]   Fax:  [phone]  Name: FLOY ANGERT MRN: [id number] Date of Birth: Oct 09, 1929

## 2020-03-31 ENCOUNTER — Other Ambulatory Visit: Payer: Self-pay

## 2020-03-31 ENCOUNTER — Ambulatory Visit: Payer: PPO

## 2020-03-31 DIAGNOSIS — R42 Dizziness and giddiness: Secondary | ICD-10-CM | POA: Diagnosis not present

## 2020-03-31 NOTE — Therapy (Signed)
[facility] [address] [place], [place], [address] Phone: [phone]   Fax:  [phone]  Physical Therapy Treatment/Discharge Summary  Patient Details  Name: Roberta [name] MRN: [id number] Date of Birth: September 12, 1929 Referring Provider (PT): Lajean Manes, MD  PHYSICAL THERAPY DISCHARGE SUMMARY  Visits from Start of Care: 2  Current functional level related to goals / functional outcomes: See Clinical Impression Statement   Remaining deficits: None   Education / Equipment: Educated on BPPV; Potential for Reoccurrences   Plan: Patient agrees to discharge.  Patient goals were met. Patient is being discharged due to meeting the stated rehab goals.  ?????          Encounter Date: 03/31/2020   PT End of Session - 03/31/20 0934    Visit Number 2    Number of Visits 2    Date for PT Re-Evaluation 37/34/28   re-cert at follow up visit   Authorization Type HTA    PT Start Time 0932    PT Stop Time 0951    PT Time Calculation (min) 19 min    Activity Tolerance Patient tolerated treatment well    Behavior During Therapy [facility] for tasks assessed/performed           Past Medical History:  Diagnosis Date  . Anemia   . Cervical disc disease   . Glaucoma   . Hypercalcemia   . Hypercholesteremia   . Osteoporosis 2/15   Started Fosamax  . Thyroid disease    Hyperparathyroid  . Venous insufficiency     History reviewed. No pertinent surgical history.  There were no vitals filed for this visit.   Subjective Assessment - 03/31/20 0935    Subjective Patient repotrs no episodes of dizziness since last visit. Has stopped taking meclizine and reports no dizziness at all. No falls.    Patient is accompained by: Family member   Daughter   Pertinent History Osteoporosis, Anemia, Glaucoma, Hypercholesteremia, Thyroid Disease, Venous Insufficiency    Limitations Walking    Patient Stated Goals Get rid of  dizziness; get back how she was prior    Currently in Pain? No/denies              [facility] PT Assessment - 03/31/20 0001      Assessment   Medical Diagnosis BPPV/Dizziness    Referring Provider (PT) Lajean Manes, MD      Observation/Other Assessments   Focus on Therapeutic Outcomes (FOTO)  90    Other Surveys  Dizziness Handicap Inventory [facility])    Dizziness Handicap Inventory [facility])  0/100               Vestibular Assessment - 03/31/20 0001      Positional Testing   Dix-Hallpike Dix-Hallpike Right;Dix-Hallpike Left      Dix-Hallpike Right   Dix-Hallpike Right Duration None    Dix-Hallpike Right Symptoms No nystagmus      Dix-Hallpike Left   Dix-Hallpike Left Duration None    Dix-Hallpike Left Symptoms No nystagmus              PT Education - 03/31/20 0948    Education Details Educated on Resultion of BPPV    Person(s) Educated Patient;Child(ren)   Son   Methods Explanation    Comprehension Verbalized understanding            PT Short Term Goals - 03/24/20 1323      PT SHORT TERM GOAL #1   Title = LTGs  PT Long Term Goals - 03/31/20 0950      PT LONG TERM GOAL #1   Title Patient will improve DHI to </= 10 to demonstrate improvements in symptoms and improve QoL    Baseline 0/100    Time 2    Period Weeks    Status Achieved      PT LONG TERM GOAL #2   Title Patient will demosntrate no symptoms with positional testing or bed mobility to demonstrate resolution of BPPV    Baseline no nystagmus/vertigo with bed mobility/positional testing    Time 2    Period Weeks    Status Achieved                 Plan - 03/31/20 0954    Clinical Impression Statement Today's skilled PT session included reassesment of L Posterior Canal Canalthiasis. Patient report sno instances of bed mobility. No nystagmus or symptoms noted with positional testing today demonstrating resolution. PT educated patietn on potential reoccurence of BPPV.  Completed DHI with patient scoring 0/100 today. Patient ready for discharge nad verbalizing agreement to discharge due to resolution of BPPV.    Personal Factors and Comorbidities Comorbidity 3+;Age    Comorbidities Osteoporosis, Anemia, Glaucoma, Hypercholesteremia, Thyroid Disease, Venous Insufficiency    Examination-Activity Limitations Bed Mobility    Examination-Participation Restrictions Driving;Community Activity    Stability/Clinical Decision Making Stable/Uncomplicated    PT Frequency --   follow up visit   PT Duration --   follow up visit   PT Treatment/Interventions Canalith Repostioning;ADLs/Self Care Home Management;DME Instruction;Stair training;Functional mobility training;Therapeutic activities;Therapeutic exercise;Gait training;Balance training;Neuromuscular re-education;Patient/family education;Vestibular;Passive range of motion    Consulted and Agree with Plan of Care Patient;Family member/caregiver    Family Member Consulted Daughter           Patient will benefit from skilled therapeutic intervention in order to improve the following deficits and impairments:  Dizziness,Decreased balance  Visit Diagnosis: Dizziness and giddiness     Problem List Patient Active Problem List   Diagnosis Date Noted  . Hypercholesteremia 01/01/2014    Jones Bales, PT, DPT 03/31/2020, 9:56 AM  [facility] [address] [place], [place], [address] Phone: [phone]   Fax:  [phone]  Name: Roberta [name] MRN: [id number] Date of Birth: [birth year]

## 2020-04-07 DIAGNOSIS — M25552 Pain in left hip: Secondary | ICD-10-CM | POA: Diagnosis not present

## 2020-05-27 DIAGNOSIS — M810 Age-related osteoporosis without current pathological fracture: Secondary | ICD-10-CM | POA: Diagnosis not present

## 2020-07-28 ENCOUNTER — Emergency Department (HOSPITAL_COMMUNITY): Payer: PPO

## 2020-07-28 ENCOUNTER — Other Ambulatory Visit: Payer: Self-pay

## 2020-07-28 ENCOUNTER — Inpatient Hospital Stay (HOSPITAL_COMMUNITY)
Admission: EM | Admit: 2020-07-28 | Discharge: 2020-07-30 | DRG: 535 | Disposition: A | Discharge status: Home Health | Payer: PPO | Attending: Internal Medicine | Admitting: Internal Medicine

## 2020-07-28 ENCOUNTER — Encounter (HOSPITAL_COMMUNITY): Payer: Self-pay

## 2020-07-28 DIAGNOSIS — E43 Unspecified severe protein-calorie malnutrition: Secondary | ICD-10-CM | POA: Diagnosis not present

## 2020-07-28 DIAGNOSIS — M7989 Other specified soft tissue disorders: Secondary | ICD-10-CM | POA: Diagnosis present

## 2020-07-28 DIAGNOSIS — Z20822 Contact with and (suspected) exposure to covid-19: Secondary | ICD-10-CM | POA: Diagnosis present

## 2020-07-28 DIAGNOSIS — R0902 Hypoxemia: Secondary | ICD-10-CM | POA: Diagnosis not present

## 2020-07-28 DIAGNOSIS — R9431 Abnormal electrocardiogram [ECG] [EKG]: Secondary | ICD-10-CM | POA: Diagnosis not present

## 2020-07-28 DIAGNOSIS — M1712 Unilateral primary osteoarthritis, left knee: Secondary | ICD-10-CM | POA: Diagnosis not present

## 2020-07-28 DIAGNOSIS — E7800 Pure hypercholesterolemia, unspecified: Secondary | ICD-10-CM | POA: Diagnosis not present

## 2020-07-28 DIAGNOSIS — Z6820 Body mass index (BMI) 20.0-20.9, adult: Secondary | ICD-10-CM | POA: Diagnosis not present

## 2020-07-28 DIAGNOSIS — Z7983 Long term (current) use of bisphosphonates: Secondary | ICD-10-CM | POA: Diagnosis not present

## 2020-07-28 DIAGNOSIS — R609 Edema, unspecified: Secondary | ICD-10-CM | POA: Diagnosis not present

## 2020-07-28 DIAGNOSIS — S32592A Other specified fracture of left pubis, initial encounter for closed fracture: Secondary | ICD-10-CM | POA: Diagnosis not present

## 2020-07-28 DIAGNOSIS — Z79899 Other long term (current) drug therapy: Secondary | ICD-10-CM | POA: Diagnosis not present

## 2020-07-28 DIAGNOSIS — R000 Tachycardia, unspecified: Secondary | ICD-10-CM | POA: Diagnosis not present

## 2020-07-28 DIAGNOSIS — M810 Age-related osteoporosis without current pathological fracture: Secondary | ICD-10-CM | POA: Diagnosis present

## 2020-07-28 DIAGNOSIS — H409 Unspecified glaucoma: Secondary | ICD-10-CM | POA: Diagnosis present

## 2020-07-28 DIAGNOSIS — M25552 Pain in left hip: Secondary | ICD-10-CM | POA: Diagnosis not present

## 2020-07-28 DIAGNOSIS — R296 Repeated falls: Secondary | ICD-10-CM | POA: Diagnosis not present

## 2020-07-28 DIAGNOSIS — W1830XA Fall on same level, unspecified, initial encounter: Secondary | ICD-10-CM | POA: Diagnosis present

## 2020-07-28 DIAGNOSIS — D72829 Elevated white blood cell count, unspecified: Secondary | ICD-10-CM | POA: Diagnosis not present

## 2020-07-28 DIAGNOSIS — I5032 Chronic diastolic (congestive) heart failure: Secondary | ICD-10-CM | POA: Diagnosis not present

## 2020-07-28 DIAGNOSIS — S32591A Other specified fracture of right pubis, initial encounter for closed fracture: Principal | ICD-10-CM | POA: Diagnosis present

## 2020-07-28 DIAGNOSIS — S32301D Unspecified fracture of right ilium, subsequent encounter for fracture with routine healing: Secondary | ICD-10-CM | POA: Diagnosis not present

## 2020-07-28 DIAGNOSIS — M25562 Pain in left knee: Secondary | ICD-10-CM | POA: Diagnosis not present

## 2020-07-28 DIAGNOSIS — E213 Hyperparathyroidism, unspecified: Secondary | ICD-10-CM | POA: Diagnosis present

## 2020-07-28 DIAGNOSIS — S0993XA Unspecified injury of face, initial encounter: Secondary | ICD-10-CM | POA: Diagnosis not present

## 2020-07-28 DIAGNOSIS — S329XXA Fracture of unspecified parts of lumbosacral spine and pelvis, initial encounter for closed fracture: Secondary | ICD-10-CM | POA: Diagnosis present

## 2020-07-28 DIAGNOSIS — Z888 Allergy status to other drugs, medicaments and biological substances status: Secondary | ICD-10-CM | POA: Diagnosis not present

## 2020-07-28 DIAGNOSIS — D509 Iron deficiency anemia, unspecified: Secondary | ICD-10-CM | POA: Diagnosis not present

## 2020-07-28 DIAGNOSIS — S199XXA Unspecified injury of neck, initial encounter: Secondary | ICD-10-CM | POA: Diagnosis not present

## 2020-07-28 DIAGNOSIS — S32511A Fracture of superior rim of right pubis, initial encounter for closed fracture: Secondary | ICD-10-CM | POA: Diagnosis not present

## 2020-07-28 DIAGNOSIS — R600 Localized edema: Secondary | ICD-10-CM | POA: Diagnosis not present

## 2020-07-28 DIAGNOSIS — Z043 Encounter for examination and observation following other accident: Secondary | ICD-10-CM | POA: Diagnosis not present

## 2020-07-28 DIAGNOSIS — I10 Essential (primary) hypertension: Secondary | ICD-10-CM | POA: Diagnosis not present

## 2020-07-28 DIAGNOSIS — R5381 Other malaise: Secondary | ICD-10-CM | POA: Diagnosis not present

## 2020-07-28 LAB — COMPREHENSIVE METABOLIC PANEL
ALT: 11 U/L (ref 0–44)
AST: 19 U/L (ref 15–41)
Albumin: 3.6 g/dL (ref 3.5–5.0)
Alkaline Phosphatase: 64 U/L (ref 38–126)
Anion gap: 5 (ref 5–15)
BUN: 25 mg/dL — ABNORMAL HIGH (ref 8–23)
CO2: 26 mmol/L (ref 22–32)
Calcium: 10.4 mg/dL — ABNORMAL HIGH (ref 8.9–10.3)
Chloride: 111 mmol/L (ref 98–111)
Creatinine, Ser: 0.88 mg/dL (ref 0.44–1.00)
GFR, Estimated: >60 mL/min (ref >60)
Glucose, Bld: 115 mg/dL — ABNORMAL HIGH (ref 70–99)
Potassium: 4.1 mmol/L (ref 3.5–5.1)
Sodium: 142 mmol/L (ref 135–145)
Total Bilirubin: 0.5 mg/dL (ref 0.3–1.2)
Total Protein: 6.3 g/dL — ABNORMAL LOW (ref 6.5–8.1)

## 2020-07-28 LAB — CBC WITH DIFFERENTIAL/PLATELET
Abs Immature Granulocytes: 0.02 10*3/uL (ref 0.00–0.07)
Basophils Absolute: 0 10*3/uL (ref 0.0–0.1)
Basophils Relative: 0 %
Eosinophils Absolute: 0.1 10*3/uL (ref 0.0–0.5)
Eosinophils Relative: 1 %
HCT: 32.4 % — ABNORMAL LOW (ref 36.0–46.0)
Hemoglobin: 10.3 g/dL — ABNORMAL LOW (ref 12.0–15.0)
Immature Granulocytes: 0 %
Lymphocytes Relative: 26 %
Lymphs Abs: 2.6 10*3/uL (ref 0.7–4.0)
MCH: 30.8 pg (ref 26.0–34.0)
MCHC: 31.8 g/dL (ref 30.0–36.0)
MCV: 97 fL (ref 80.0–100.0)
Monocytes Absolute: 0.7 10*3/uL (ref 0.1–1.0)
Monocytes Relative: 7 %
Neutro Abs: 6.6 10*3/uL (ref 1.7–7.7)
Neutrophils Relative %: 66 %
Platelets: 145 10*3/uL — ABNORMAL LOW (ref 150–400)
RBC: 3.34 MIL/uL — ABNORMAL LOW (ref 3.87–5.11)
RDW: 13 % (ref 11.5–15.5)
WBC: 10 10*3/uL (ref 4.0–10.5)
nRBC: 0 % (ref 0.0–0.2)

## 2020-07-28 LAB — I-STAT CHEM 8, ED
BUN: 23 mg/dL (ref 8–23)
Calcium, Ion: 1.34 mmol/L (ref 1.15–1.40)
Chloride: 107 mmol/L (ref 98–111)
Creatinine, Ser: 0.8 mg/dL (ref 0.44–1.00)
Glucose, Bld: 110 mg/dL — ABNORMAL HIGH (ref 70–99)
HCT: 31 % — ABNORMAL LOW (ref 36.0–46.0)
Hemoglobin: 10.5 g/dL — ABNORMAL LOW (ref 12.0–15.0)
Potassium: 4 mmol/L (ref 3.5–5.1)
Sodium: 139 mmol/L (ref 135–145)
TCO2: 23 mmol/L (ref 22–32)

## 2020-07-28 LAB — RESP PANEL BY RT-PCR (FLU A&B, COVID) ARPGX2
Influenza A by PCR: NEGATIVE
Influenza B by PCR: NEGATIVE
SARS Coronavirus 2 by RT PCR: NEGATIVE

## 2020-07-28 MED ORDER — MORPHINE SULFATE (PF) 4 MG/ML IV SOLN: 4.0000 mg | Freq: Once | INTRAVENOUS | Status: DC

## 2020-07-28 NOTE — ED Notes (Signed)
Vitals stable, pt a&ox4, family at bedside.  Pt in no acute distress, warm blanket and pillow provided.

## 2020-07-28 NOTE — H&P (Addendum)
[facility] History & Physical  Roberta Moselle MD  Referring Provider: Dr. Adele Dan 07/28/2020  Chief Complaint: Mechanical fall, bilat hip pain HPI: The patient is a 87 y.o. year-old w/ hx of anemia, DJD, HL who presented after a fall. She was walking when her "legs gave out" and she fell onto her back.  She had left hip pain. She crawled to the phone and called EMS.  She could not stand.  In ED vital signs were stable. CT pelvis showed R superior and inferior pubic ramus fractures and CT head, C-spine were negative, this per ED notes as the official readings aren't back yet. ED MD called DR Eulah Pont w/ orthopedics who recommended pain meds and they will see the patient in consultation in the morning. We are asked to see patient for admission.   Pt seen in ED. Daughter is at bedside. Pt lives in a senior apartment for many years.  Pt has no significant medical problems.  Her legs are swollen, this has been going on for 1- 2 months per the dtr. No hx of CHF. Not taking any Rx medications, except for denosumab every 6 mos.   ROS  denies CP  no joint pain   no HA  no blurry vision  no rash  no diarrhea  no nausea/ vomiting  no dysuria  no difficulty voiding  no change in urine color   Past Medical History  Past Medical History:  Diagnosis Date  . Anemia   . Cervical disc disease   . Glaucoma   . Hypercalcemia   . Hypercholesteremia   . Osteoporosis 2/15   Started Fosamax  . Thyroid disease    Hyperparathyroid  . Venous insufficiency    Past Surgical History History reviewed. No pertinent surgical history. Family History History reviewed. No pertinent family history. Social History  reports that she has never smoked. She has never used smokeless tobacco. She reports that she does not drink alcohol and does not use drugs. Allergies  Allergies  Allergen Reactions  . Alendronate Sodium Other (See Comments)   Home medications Prior to Admission medications    Medication Sig Start Date End Date Taking? Authorizing Provider  Artificial Tear Solution (SOOTHE XP) SOLN Place 1 drop into both eyes daily as needed (dry eyes).   Yes [provider]  Calcium Carb-Cholecalciferol 600-200 MG-UNIT TABS Take 1 tablet by mouth daily.   Yes [provider]  denosumab (PROLIA) 60 MG/ML SOSY injection Inject 60 mg into the skin every 6 (six) months.   Yes [provider]  Multiple Vitamin (MULTIVITAMIN) tablet Take 1 tablet by mouth daily.   Yes [provider]  naproxen sodium (ALEVE) 220 MG tablet Take 220 mg by mouth daily as needed (pain).   Yes [provider]  Trolamine Salicylate (ASPERCREME) 10 % LOTN Apply 1 application topically daily.   Yes [provider]       Exam Gen alert, no distress No rash, cyanosis or gangrene Sclera anicteric, throat clear  No jvd or bruits Chest clear bilat to bases, no rales/ wheezing RRR no MRG Abd soft ntnd no mass or ascites +bs GU deferred MS no joint effusions or deformity Ext bilat pitting 2+ edema from feet to knees, no higher  Neuro is alert, Ox 3 , nf, mild gen'd weakness       Home meds:  - naproxen 220 qd prn  - denosumab 60mg  sq every 6 mos  - prn's/ vitamins/  supplements    Na 139  K 4.0  BUN 23  Cr 0.80  Ca 10.4  Alb 3.6  AST 19/ ALT 11  tbili 0.5     WBC 10K  Hb 10.3  plt 145     COVID 19 +flu A/ B negative      BP 140/ 67  HR 88-95  RR 19- 29  Temp 97  RA 99%    CT pelvis - IMPRESSION:1. Acute comminuted fractures of the right superior and inferior pubic rami. 2. Right adnexal cystic lesion measuring 2.6 cm diameter. No follow-up imaging recommended. Note: This recommendation does not apply to premenarchal patients and to those with increased risk (genetic, family history, elevated tumor markers or other high-risk factors) of ovarian cancer. Reference: JACR 2020 Feb; 17(2):248-254       Assessment/ Plan: 1. Mechanical fall - as per  #2 2. Pelvic fractures - per CT showing R superior and inferior pubic rami fractures per ED assessment. Pt cannot walk d/t pain. Ortho consulted, will see in am.  P.T. assessment. May need rehab stay. Pain control.  3. Lower ext edema - this is relatively new, order echo, no hx CHF.  Not taking any diuretics. Start lasix 20 mg po qd.  4. Anemia - Hb 10, mild, follow      Roberta Moselle  MD 07/28/2020, 11:40 PM

## 2020-07-28 NOTE — ED Notes (Signed)
Purewick has been placed.  

## 2020-07-28 NOTE — ED Provider Notes (Signed)
**Roberta [name] De-Identified via Obfuscation** Roberta Roberta [name]   CSN: [id number] Arrival date & time: 07/28/20  1850     History Chief Complaint  Patient presents with  . Fall    Roberta Roberta [name] is a 87 y.o. female history of anemia, arthritis, high cholesterol here presenting with a fall.  Patient was walking and her legs gave out and she had a mechanical fall onto her back.  She was unclear if she hit her head or not.  She was noted to have left hip pain and possible shortening of the left hip. She states that she was able to crawl and eventually called EMS.  She states that she was unable to stand afterwards  The history is provided by the patient.       Past Medical History:  Diagnosis Date  . Anemia   . Cervical disc disease   . Glaucoma   . Hypercalcemia   . Hypercholesteremia   . Osteoporosis 2/15   Started Fosamax  . Thyroid disease    Hyperparathyroid  . Venous insufficiency     Patient Active Problem List   Diagnosis Date Noted  . Hypercholesteremia 01/01/2014    History reviewed. No pertinent surgical history.   OB History   No obstetric history on file.     History reviewed. No pertinent family history.  Social History   Tobacco Use  . Smoking status: Never Smoker  . Smokeless tobacco: Never Used  Substance Use Topics  . Alcohol use: No  . Drug use: No    Home Medications Prior to Admission medications   Medication Sig Start Date End Date Taking? Authorizing Provider  alendronate (FOSAMAX) 70 MG tablet Take 70 mg by mouth once a week. Take with a full glass of water on an empty stomach. Patient not taking: Reported on 03/24/2020    [provider]  Calcium Carbonate-Vitamin D (CALCIUM-D PO) Take 1 tablet by mouth daily.    [provider]  meclizine (ANTIVERT) 25 MG tablet Take 25 mg by mouth 3 (three) times daily as needed for dizziness.    [provider]  Multiple Vitamin (MULTIVITAMIN) tablet Take 1 tablet by  mouth daily.    [provider]    Allergies    Alendronate sodium  Review of Systems   Review of Systems  Musculoskeletal:       L hip pain   All other systems reviewed and are negative.   Physical Exam Updated Vital Signs BP 129/65   Pulse 84   Temp 97.7 F (36.5 C) (Oral)   Resp 18   SpO2 98%   Physical Exam Vitals and nursing Roberta [name] reviewed.  Constitutional:      Comments: Uncomfortable  HENT:     Head: Normocephalic.     Mouth/Throat:     Mouth: Mucous membranes are moist.  Eyes:     Extraocular Movements: Extraocular movements intact.     Pupils: Pupils are equal, round, and reactive to light.  Neck:     Comments: C-collar in place Cardiovascular:     Rate and Rhythm: Normal rate and regular rhythm.     Pulses: Normal pulses.     Heart sounds: Normal heart sounds.  Pulmonary:     Effort: Pulmonary effort is normal.     Breath sounds: Normal breath sounds.  Abdominal:     General: Abdomen is flat.     Palpations: Abdomen is soft.  Musculoskeletal:     Comments: Patient has mild left  pelvic tenderness.  Patient is able to range the left hip and hip does not appear shortened.  She has no tenderness in the left knee.  No spinal tenderness  Skin:    General: Skin is warm.     Capillary Refill: Capillary refill takes less than 2 seconds.  Neurological:     General: No focal deficit present.     Mental Status: She is oriented to person, place, and time.  Psychiatric:        Mood and Affect: Mood normal.        Behavior: Behavior normal.     ED Results / Procedures / Treatments   Labs (all labs ordered are listed, but only abnormal results are displayed) Labs Reviewed  CBC WITH DIFFERENTIAL/PLATELET - Abnormal; Notable for the following components:      Result Value   RBC 3.34 (*)    Hemoglobin 10.3 (*)    HCT 32.4 (*)    Platelets 145 (*)    All other components within normal limits  I-STAT CHEM 8, ED - Abnormal; Notable for the following  components:   Glucose, Bld 110 (*)    Hemoglobin 10.5 (*)    HCT 31.0 (*)    All other components within normal limits  RESP PANEL BY RT-PCR (FLU A&B, COVID) [id number]  COMPREHENSIVE METABOLIC PANEL    EKG EKG Interpretation  Date/Time:  Wednesday July 28 2020 19:21:21 EDT Ventricular Rate:  90 PR Interval:  152 QRS Duration: 85 QT Interval:  356 QTC Calculation: 436 R Axis:   2 Text Interpretation: Sinus rhythm Low voltage, precordial leads Abnormal R-wave progression, early transition Borderline T abnormalities, anterior leads ST elevation, consider inferior injury No significant change since last tracing Confirmed by Richardean Canal [phone]) on 07/28/2020 7:45:30 PM   Radiology No results found.  Procedures Procedures   Medications Ordered in ED Medications  morphine 4 MG/ML injection 4 mg (has no administration in time range)    ED Course  I have reviewed the triage vital signs and the nursing notes.  Pertinent labs & imaging results that were available during my care of the patient were reviewed by me and considered in my medical decision making (see chart for details).    MDM Rules/Calculators/A&P                         Roberta Roberta [name] is a 87 y.o. female here presenting with left hip pain after fall.  Consider pelvic versus hip fracture.  We will get preop labs and get CT head and cervical spine and CT pelvis.  9:51 PM CT showed right superior and inferior pubic rami fractures.  Discussed case with orthopedic doctor, Dr. Eulah Pont.  He recommend pain control and will see patient as consult.  Hospitalist to admit.  Patient will likely need rehab.   Final Clinical Impression(s) / ED Diagnoses Final diagnoses:  None    Rx / DC Orders ED Discharge Orders    None       Charlynne Pander, MD 07/28/20 2151

## 2020-07-28 NOTE — ED Notes (Signed)
Pt refusing pain medication at this time, pt states her pain is a 0/10 currently and that she was given pain medication by ems.  Pt aware to let RN know if she begins to have pain, call light in reach, family at bedside.

## 2020-07-28 NOTE — ED Triage Notes (Addendum)
Patient BIB [facility] from home after a mechanical fall onto her back.  Patient does not think she hit her head, denies LOC or blood thinner use.  EMS placed 18g right AC and patient got of fentanyl IV in route.  Shortening of the left leg.  EMS placed c-collar.  Vitals were 162/87 80-HR 14-RR 96% room air

## 2020-07-28 NOTE — H&P (Incomplete)
87 yo relatively healthy, fell at home mech fall, bilat hip and back pain, CT shows R puic rami fx, no hip fx, dr Roberta [name] will see tomorrow ortho, got fentanyl and mso4 in ED.      [facility] History & Physical  Roberta Moselle MD  Referring Provider: ***  Roberta [name] 07/28/2020  Chief Complaint: *** HPI: The patient is a 87 y.o. year-old ***      ROS  denies CP  no joint pain   no HA  no blurry vision  no rash  no diarrhea  no nausea/ vomiting  no dysuria  no difficulty voiding  no change in urine color   Past Medical History  Past Medical History:  Diagnosis Date  . Anemia   . Cervical disc disease   . Glaucoma   . Hypercalcemia   . Hypercholesteremia   . Osteoporosis 2/15   Started Fosamax  . Thyroid disease    Hyperparathyroid  . Venous insufficiency    Past Surgical History History reviewed. No pertinent surgical history. Family History History reviewed. No pertinent family history. Social History  reports that she has never smoked. She has never used smokeless tobacco. She reports that she does not drink alcohol and does not use drugs. Allergies  Allergies  Allergen Reactions  . Alendronate Sodium Other (See Comments)   Home medications Prior to Admission medications   Medication Sig Start Date End Date Taking? Authorizing Provider  Artificial Tear Solution (SOOTHE XP) SOLN Place 1 drop into both eyes daily as needed (dry eyes).   Yes [provider]  Calcium Carb-Cholecalciferol 600-200 MG-UNIT TABS Take 1 tablet by mouth daily.   Yes [provider]  denosumab (PROLIA) 60 MG/ML SOSY injection Inject 60 mg into the skin every 6 (six) months.   Yes [provider]  Multiple Vitamin (MULTIVITAMIN) tablet Take 1 tablet by mouth daily.   Yes [provider]  naproxen sodium (ALEVE) 220 MG tablet Take 220 mg by mouth daily as needed (pain).   Yes [provider]  Trolamine Salicylate (ASPERCREME)  10 % LOTN Apply 1 application topically daily.   Yes [provider]       Exam Gen *** No rash, cyanosis or gangrene Sclera anicteric, throat clear ***  No jvd or bruits *** Chest clear bilat *** RRR no MRG *** Abd soft ntnd no mass or ascites +bs *** GU normal *** defer MS no joint effusions or deformity *** Ext *** edema, no wounds or ulcers *** Neuro is alert, Ox 3 , nf ***    Home meds:  - ***     Assessment/ Plan: 1. ***      Roberta Moselle  MD 07/28/2020, 10:11 PM

## 2020-07-29 ENCOUNTER — Other Ambulatory Visit (HOSPITAL_COMMUNITY): Payer: PPO

## 2020-07-29 DIAGNOSIS — S32591A Other specified fracture of right pubis, initial encounter for closed fracture: Secondary | ICD-10-CM | POA: Diagnosis present

## 2020-07-29 DIAGNOSIS — E7800 Pure hypercholesterolemia, unspecified: Secondary | ICD-10-CM | POA: Diagnosis present

## 2020-07-29 DIAGNOSIS — R5381 Other malaise: Secondary | ICD-10-CM | POA: Diagnosis not present

## 2020-07-29 DIAGNOSIS — Z7983 Long term (current) use of bisphosphonates: Secondary | ICD-10-CM | POA: Diagnosis not present

## 2020-07-29 DIAGNOSIS — Z888 Allergy status to other drugs, medicaments and biological substances status: Secondary | ICD-10-CM | POA: Diagnosis not present

## 2020-07-29 DIAGNOSIS — M810 Age-related osteoporosis without current pathological fracture: Secondary | ICD-10-CM | POA: Diagnosis present

## 2020-07-29 DIAGNOSIS — H409 Unspecified glaucoma: Secondary | ICD-10-CM | POA: Diagnosis present

## 2020-07-29 DIAGNOSIS — S329XXA Fracture of unspecified parts of lumbosacral spine and pelvis, initial encounter for closed fracture: Secondary | ICD-10-CM

## 2020-07-29 DIAGNOSIS — Z79899 Other long term (current) drug therapy: Secondary | ICD-10-CM | POA: Diagnosis not present

## 2020-07-29 DIAGNOSIS — R600 Localized edema: Secondary | ICD-10-CM | POA: Insufficient documentation

## 2020-07-29 DIAGNOSIS — R296 Repeated falls: Secondary | ICD-10-CM | POA: Diagnosis present

## 2020-07-29 DIAGNOSIS — S32301D Unspecified fracture of right ilium, subsequent encounter for fracture with routine healing: Secondary | ICD-10-CM | POA: Diagnosis not present

## 2020-07-29 DIAGNOSIS — Z20822 Contact with and (suspected) exposure to covid-19: Secondary | ICD-10-CM | POA: Diagnosis present

## 2020-07-29 DIAGNOSIS — M7989 Other specified soft tissue disorders: Secondary | ICD-10-CM | POA: Diagnosis present

## 2020-07-29 DIAGNOSIS — Z6820 Body mass index (BMI) 20.0-20.9, adult: Secondary | ICD-10-CM | POA: Diagnosis not present

## 2020-07-29 DIAGNOSIS — E213 Hyperparathyroidism, unspecified: Secondary | ICD-10-CM | POA: Diagnosis present

## 2020-07-29 DIAGNOSIS — I5032 Chronic diastolic (congestive) heart failure: Secondary | ICD-10-CM | POA: Diagnosis present

## 2020-07-29 DIAGNOSIS — E43 Unspecified severe protein-calorie malnutrition: Secondary | ICD-10-CM | POA: Insufficient documentation

## 2020-07-29 DIAGNOSIS — W1830XA Fall on same level, unspecified, initial encounter: Secondary | ICD-10-CM | POA: Diagnosis present

## 2020-07-29 DIAGNOSIS — D72829 Elevated white blood cell count, unspecified: Secondary | ICD-10-CM | POA: Diagnosis present

## 2020-07-29 DIAGNOSIS — D509 Iron deficiency anemia, unspecified: Secondary | ICD-10-CM | POA: Diagnosis present

## 2020-07-29 LAB — BASIC METABOLIC PANEL
Anion gap: 7 (ref 5–15)
BUN: 21 mg/dL (ref 8–23)
CO2: 25 mmol/L (ref 22–32)
Calcium: 10.1 mg/dL (ref 8.9–10.3)
Chloride: 110 mmol/L (ref 98–111)
Creatinine, Ser: 0.69 mg/dL (ref 0.44–1.00)
GFR, Estimated: >60 mL/min (ref >60)
Glucose, Bld: 109 mg/dL — ABNORMAL HIGH (ref 70–99)
Potassium: 3.9 mmol/L (ref 3.5–5.1)
Sodium: 142 mmol/L (ref 135–145)

## 2020-07-29 LAB — BRAIN NATRIURETIC PEPTIDE: B Natriuretic Peptide: 74 pg/mL (ref 0.0–100.0)

## 2020-07-29 MED ORDER — ADULT MULTIVITAMIN W/MINERALS CH
1.0000 | ORAL_TABLET | Freq: Every day | ORAL | Status: DC
  Administered 2020-07-29 – 2020-07-30 (×2): 1 via ORAL
  Filled 2020-07-29 (×2): qty 1

## 2020-07-29 MED ORDER — PROSOURCE PLUS PO LIQD
30.0000 mL | Freq: Three times a day (TID) | ORAL | Status: DC
  Administered 2020-07-29 – 2020-07-30 (×4): 30 mL via ORAL
  Filled 2020-07-29 (×3): qty 30

## 2020-07-29 MED ORDER — ENSURE ENLIVE PO LIQD
237.0000 mL | Freq: Three times a day (TID) | ORAL | Status: DC
  Administered 2020-07-29 – 2020-07-30 (×4): 237 mL via ORAL

## 2020-07-29 MED ORDER — SODIUM CHLORIDE 0.9% FLUSH: 3.0000 mL | INTRAVENOUS | Status: DC | PRN

## 2020-07-29 MED ORDER — FUROSEMIDE 20 MG PO TABS
20.0000 mg | ORAL_TABLET | Freq: Every day | ORAL | Status: DC
  Administered 2020-07-29 – 2020-07-30 (×2): 20 mg via ORAL
  Filled 2020-07-29 (×2): qty 1

## 2020-07-29 MED ORDER — HYDROCODONE-ACETAMINOPHEN 5-325 MG PO TABS
1.0000 | ORAL_TABLET | ORAL | Status: DC | PRN
  Administered 2020-07-30: 1 via ORAL
  Filled 2020-07-29: qty 1

## 2020-07-29 MED ORDER — ENOXAPARIN SODIUM 40 MG/0.4ML ~~LOC~~ SOLN
40.0000 mg | SUBCUTANEOUS | Status: DC
  Administered 2020-07-29 – 2020-07-30 (×2): 40 mg via SUBCUTANEOUS
  Filled 2020-07-29 (×2): qty 0.4

## 2020-07-29 MED ORDER — ACETAMINOPHEN 325 MG PO TABS
650.0000 mg | ORAL_TABLET | Freq: Four times a day (QID) | ORAL | Status: DC | PRN
  Administered 2020-07-29: 650 mg via ORAL
  Filled 2020-07-29: qty 2

## 2020-07-29 MED ORDER — ACETAMINOPHEN 650 MG RE SUPP: 650.0000 mg | Freq: Four times a day (QID) | RECTAL | Status: DC | PRN

## 2020-07-29 MED ORDER — SODIUM CHLORIDE 0.9% FLUSH
3.0000 mL | Freq: Two times a day (BID) | INTRAVENOUS | Status: DC
  Administered 2020-07-29 – 2020-07-30 (×3): 3 mL via INTRAVENOUS

## 2020-07-29 MED ORDER — MORPHINE SULFATE (PF) 2 MG/ML IV SOLN: 2.0000 mg | INTRAVENOUS | Status: DC | PRN

## 2020-07-29 MED ORDER — SODIUM CHLORIDE 0.9 % IV SOLN: 250.0000 mL | INTRAVENOUS | Status: DC | PRN

## 2020-07-29 NOTE — Progress Notes (Addendum)
Initial Nutrition Assessment  DOCUMENTATION CODES:  Severe malnutrition in context of chronic illness  INTERVENTION:  Obtain updated height and weight.  Continue current diet order.  Add Ensure Enlive po TID, each supplement provides 350 kcal and 20 grams of protein.  Add 30 ml ProSource Plus po TID, each supplement provides 100 kcal and 15 grams of protein.   Add MVI with minerals daily.  NUTRITION DIAGNOSIS:  Severe Malnutrition related to chronic illness as evidenced by severe fat depletion,moderate muscle depletion,severe muscle depletion,energy intake < or equal to 75% for > or equal to 1 month.  GOAL:  Patient will meet greater than or equal to 90% of their needs  MONITOR:  PO intake,Supplement acceptance,Labs,Weight trends,I & O's  REASON FOR ASSESSMENT:  Malnutrition Screening Tool    ASSESSMENT:  87 yo female with a PMH of anemia, DJD, and osteoporosis who presents with a pelvic fracture after a fall at home.  Spoke with pt and daughter at bedside. Pt reports that she only eats what she likes to eat. Daughter recalled that she eats cereal or toast for breakfast with coffee, a sandwich for lunch, and either skips dinner or has a sandwich or cereal again. Sometimes she will snack on crackers or a candy bar throughout the day.  When asked about weight, pt reports that she "does not worry about that and has no idea what she weighs." Attempted to weigh pt at bedside and wt was 116 lbs (53.1 kg) with an error on bed scale. Please obtain pt's height and weight standing, if possible.  On exam, pt has severe depletions in every fat and muscle store. Pt severely malnourished.  Recommend adding Ensure Enlive TID and ProSource TID to promote caloric and protein intake. Also recommend adding MVI with minerals daily.  Medications: lasix, morphine Labs: reviewed; Glucose 109-115  NUTRITION - FOCUSED PHYSICAL EXAM: Flowsheet Row Most Recent Value  Orbital Region Severe depletion   Upper Arm Region Severe depletion  Thoracic and Lumbar Region Severe depletion  Buccal Region Severe depletion  Temple Region Severe depletion  Clavicle Bone Region Severe depletion  Clavicle and Acromion Bone Region Severe depletion  Scapular Bone Region Severe depletion  Dorsal Hand Severe depletion  Patellar Region Severe depletion  Anterior Thigh Region Moderate depletion  Posterior Calf Region Moderate depletion  Edema (RD Assessment) Moderate  Hair Reviewed  Eyes Reviewed  Mouth Reviewed  Skin Reviewed  Nails Reviewed  [toenails overgrown and curled into toes]     Diet Order:   Diet Order            Diet regular Room service appropriate? Yes; Fluid consistency: Thin  Diet effective now                EDUCATION NEEDS:  Education needs have been addressed  Skin:  Skin Assessment: Reviewed RN Assessment (Dry, flaky skin)  Last BM:  07/28/20  Height:  Ht Readings from Last 1 Encounters:  No data found for Ht   Weight:  Wt Readings from Last 1 Encounters:  No data found for Wt   BMI:  There is no height or weight on file to calculate BMI.  Estimated Nutritional Needs: used 53.1 kg Kcal:  1500-1700 Protein:  70-85 grams Fluid:  >1.5 L  Vertell Limber, RD, LDN Registered Dietitian After Hours/Weekend Pager # in [place]

## 2020-07-29 NOTE — Progress Notes (Signed)
PROGRESS NOTE  Roberta [name] [name] DOB: 01/26/1930 DOA: 07/28/2020 PCP: [name], MD  HPI/Recap of past 24 hours: HPI from Dr Roberta [name] 87 y.o. year-old w/ hx of anemia, DJD, HL who presented after a fall. She was walking when her "legs gave out" and she fell onto her back.  She had left hip pain. She crawled to the phone and called EMS.  She could not stand. Of note, pt has had about 3 falls since this year. In ED vital signs were stable. CT pelvis showed R superior and inferior pubic ramus fractures and CT head, C-spine were negative. ED MD called DR Eulah Pont w/ orthopedics who recommended pain meds and will consult. Pt lives in a senior apartment for many years.  Pt has no significant medical problems. Noted BLE edema, with some superficial wounds. Per daughter, has been going on for 1- 2 months. No hx of CHF. Not taking any Rx medications, except for denosumab every 6 mos. patient admitted for further management.    Today, patient denies any new complaints, denies any worsening left hip pain, low back pain.  Patient denies any chest pain, shortness of breath, abdominal pain, nausea/vomiting, fever/chills.  Daughter at bedside, discussed plan with both patient and daughter.    Assessment/Plan: Active Problems:   Pelvic fracture (HCC)   Protein-calorie malnutrition, severe   Acute comminuted R superior and inferior pubic ramus fracture Mechanical fall CT pelvis showed above Orthopedics consulted, nonsurgical management recommended Pain management PT/OT  BLE edema Unknown etiology, Of note, ??denosumab can cause peripheral edema BNP 74 Chest x-ray, no active disease Echo pending Continue low-dose Lasix Monitor closely  Normocytic anemia No previous labs to compare Anemia panel pending Daily cbc  Hx of osteoporosis Continue denosumab       Malnutrition Type:  Nutrition Problem: Severe Malnutrition Etiology: chronic illness   Malnutrition  Characteristics:  Signs/Symptoms: severe fat depletion,moderate muscle depletion,severe muscle depletion,energy intake < or equal to 75% for > or equal to 1 month   Nutrition Interventions:  Interventions: Ensure Enlive (each supplement provides 350kcal and 20 grams of protein),MVI,Prostat    There is no height or weight on file to calculate BMI.     Code Status: Full  Family Communication: Daughter at bedside  Disposition Plan: Status is: Inpatient  Remains inpatient appropriate because:Inpatient level of care appropriate due to severity of illness   Dispo: The patient is from: ILF              Anticipated d/c is to: SNF              Patient currently is not medically stable to d/c.   Difficult to place patient No     Consultants: Orthopedics  Procedures:  None  Antimicrobials:  None  DVT prophylaxis: Lovenox   Objective: Vitals:   07/29/20 0145 07/29/20 0220 07/29/20 0513 07/29/20 0900  BP: (!) 144/67 (!) 161/56 120/70 (!) 122/51  Pulse: 88 94 88 85  Resp: (!) 23 16 16 16   Temp:  98 F (36.7 C) 97.7 F (36.5 C) 98.1 F (36.7 C)  TempSrc:  Oral Oral Oral  SpO2: 99% 100% 100% 98%    Intake/Output Summary (Last 24 hours) at 07/29/2020 1313 Last data filed at 07/29/2020 1000 Gross per 24 hour  Intake 270 ml  Output 750 ml  Net -480 ml   There were no vitals filed for this visit.  Exam:  General: NAD   Cardiovascular: S1, S2 present  Respiratory: CTAB  Abdomen: Soft, nontender, nondistended, bowel sounds present  Musculoskeletal: 1+ bilateral pedal edema noted, b/l hips non TTP  Skin: Normal  Psychiatry: Normal mood    Data Reviewed: CBC: Recent Labs  Lab 07/28/20 1915 07/28/20 2138  WBC 10.0  --   NEUTROABS 6.6  --   HGB 10.3* 10.5*  HCT 32.4* 31.0*  MCV 97.0  --   PLT 145*  --    Basic Metabolic Panel: Recent Labs  Lab 07/28/20 2130 07/28/20 2138 07/29/20 0433  NA 142 139 142  K 4.1 4.0 3.9  CL 111 107 110  CO2  26  --  25  GLUCOSE 115* 110* 109*  BUN 25* 23 21  CREATININE 0.88 0.80 0.69  CALCIUM 10.4*  --  10.1   GFR: CrCl cannot be calculated (Unknown ideal weight.). Liver Function Tests: Recent Labs  Lab 07/28/20 2130  AST 19  ALT 11  ALKPHOS 64  BILITOT 0.5  PROT 6.3*  ALBUMIN 3.6   No results for input(s): LIPASE, AMYLASE in the last 168 hours. No results for input(s): AMMONIA in the last 168 hours. Coagulation Profile: No results for input(s): INR, PROTIME in the last 168 hours. Cardiac Enzymes: No results for input(s): CKTOTAL, CKMB, CKMBINDEX, TROPONINI in the last 168 hours. BNP (last 3 results) No results for input(s): PROBNP in the last 8760 hours. HbA1C: No results for input(s): HGBA1C in the last 72 hours. CBG: No results for input(s): GLUCAP in the last 168 hours. Lipid Profile: No results for input(s): CHOL, HDL, LDLCALC, TRIG, CHOLHDL, LDLDIRECT in the last 72 hours. Thyroid Function Tests: No results for input(s): TSH, T4TOTAL, FREET4, T3FREE, THYROIDAB in the last 72 hours. Anemia Panel: No results for input(s): VITAMINB12, FOLATE, FERRITIN, TIBC, IRON, RETICCTPCT in the last 72 hours. Urine analysis: No results found for: COLORURINE, APPEARANCEUR, LABSPEC, PHURINE, GLUCOSEU, HGBUR, BILIRUBINUR, KETONESUR, PROTEINUR, UROBILINOGEN, NITRITE, LEUKOCYTESUR Sepsis Labs: @LABRCNTIP (procalcitonin:4,lacticidven:4)  ) Recent Results (from the past 240 hour(s))  Resp Panel by RT-PCR (Flu A&B, Covid) Nasopharyngeal Swab     Status: None   Collection Time: 07/28/20  7:52 PM   Specimen: Nasopharyngeal Swab; Nasopharyngeal(NP) swabs in vial transport medium  Result Value Ref Range Status   SARS Coronavirus 2 by RT PCR NEGATIVE NEGATIVE Final    Comment: (NOTE) SARS-CoV-2 target nucleic acids are NOT DETECTED.  The SARS-CoV-2 RNA is generally detectable in upper respiratory specimens during the acute phase of infection. The lowest concentration of SARS-CoV-2 viral  copies this assay can detect is 138 copies/mL. A negative result does not preclude SARS-Cov-2 infection and should not be used as the sole basis for treatment or other patient management decisions. A negative result may occur with  improper specimen collection/handling, submission of specimen other than nasopharyngeal swab, presence of viral mutation(s) within the areas targeted by this assay, and inadequate number of viral copies(<138 copies/mL). A negative result must be combined with clinical observations, patient history, and epidemiological information. The expected result is Negative.  Fact Sheet for Patients:  07/30/20  Fact Sheet for Healthcare Providers:  BloggerCourse.com  This test is no t yet approved or cleared by the [place] FDA and  has been authorized for detection and/or diagnosis of SARS-CoV-2 by FDA under an Emergency Use Authorization (EUA). This EUA will remain  in effect (meaning this test can be used) for the duration of the COVID-19 declaration under Section 564(b)(1) of the Act, 21 U.S.C.section 360bbb-3(b)(1), unless the authorization is terminated  or revoked sooner.  Influenza A by PCR NEGATIVE NEGATIVE Final   Influenza B by PCR NEGATIVE NEGATIVE Final    Comment: (NOTE) The Xpert Xpress SARS-CoV-2/FLU/RSV plus assay is intended as an aid in the diagnosis of influenza from Nasopharyngeal swab specimens and should not be used as a sole basis for treatment. Nasal washings and aspirates are unacceptable for Xpert Xpress SARS-CoV-2/FLU/RSV testing.  Fact Sheet for Patients: BloggerCourse.comhttps://www.fda.gov/media/152166/download  Fact Sheet for Healthcare Providers: SeriousBroker.ithttps://www.fda.gov/media/152162/download  This test is not yet approved or cleared by the [place] FDA and has been authorized for detection and/or diagnosis of SARS-CoV-2 by FDA under an Emergency Use Authorization (EUA). This  EUA will remain in effect (meaning this test can be used) for the duration of the COVID-19 declaration under Section 564(b)(1) of the Act, 21 U.S.C. section 360bbb-3(b)(1), unless the authorization is terminated or revoked.  Performed at [facility], [address] [place], [place] [id number]       Studies: DG Chest 1 View  Result Date: 07/28/2020 CLINICAL DATA:  Left knee pain after a fall. EXAM: CHEST  1 VIEW COMPARISON:  08/21/2007 FINDINGS: The heart size and mediastinal contours are within normal limits. Both lungs are clear. The visualized skeletal structures are unremarkable. IMPRESSION: No active disease. Electronically Signed   By: Burman NievesWilliam  Stevens M.D.   On: 07/28/2020 20:20   CT Head Wo Contrast  Result Date: 07/28/2020 CLINICAL DATA:  87 year old female with facial trauma. EXAM: CT HEAD WITHOUT CONTRAST CT CERVICAL SPINE WITHOUT CONTRAST TECHNIQUE: Multidetector CT imaging of the head and cervical spine was performed following the standard protocol without intravenous contrast. Multiplanar CT image reconstructions of the cervical spine were also generated. COMPARISON:  None. FINDINGS: CT HEAD FINDINGS Brain: There is moderate age-related atrophy and chronic microvascular ischemic changes. There is no acute intracranial hemorrhage. No mass effect or midline shift no extra-axial fluid collection. Vascular: No hyperdense vessel or unexpected calcification. Skull: Normal. Negative for fracture or focal lesion. Sinuses/Orbits: No acute finding. Other: None CT CERVICAL SPINE FINDINGS Alignment: No acute subluxation. Skull base and vertebrae: No acute fracture. Soft tissues and spinal canal: No prevertebral fluid or swelling. No visible canal hematoma. Disc levels:  Multilevel degenerative changes. No acute findings. Upper chest: Negative. Other: Heterogeneous thyroid gland with several nodules. In the setting of significant comorbidities or limited life expectancy, no  follow-up recommended (ref: J Am Coll Radiol. 2015 Feb;12(2): 143-50). IMPRESSION: 1. No acute intracranial pathology. Moderate age-related atrophy and chronic microvascular ischemic changes. 2. No acute/traumatic cervical spine pathology. Electronically Signed   By: Elgie CollardArash  Radparvar M.D.   On: 07/28/2020 20:16   CT Cervical Spine Wo Contrast  Result Date: 07/28/2020 CLINICAL DATA:  87 year old female with facial trauma. EXAM: CT HEAD WITHOUT CONTRAST CT CERVICAL SPINE WITHOUT CONTRAST TECHNIQUE: Multidetector CT imaging of the head and cervical spine was performed following the standard protocol without intravenous contrast. Multiplanar CT image reconstructions of the cervical spine were also generated. COMPARISON:  None. FINDINGS: CT HEAD FINDINGS Brain: There is moderate age-related atrophy and chronic microvascular ischemic changes. There is no acute intracranial hemorrhage. No mass effect or midline shift no extra-axial fluid collection. Vascular: No hyperdense vessel or unexpected calcification. Skull: Normal. Negative for fracture or focal lesion. Sinuses/Orbits: No acute finding. Other: None CT CERVICAL SPINE FINDINGS Alignment: No acute subluxation. Skull base and vertebrae: No acute fracture. Soft tissues and spinal canal: No prevertebral fluid or swelling. No visible canal hematoma. Disc levels:  Multilevel degenerative changes. No acute findings. Upper  chest: Negative. Other: Heterogeneous thyroid gland with several nodules. In the setting of significant comorbidities or limited life expectancy, no follow-up recommended (ref: J Am Coll Radiol. 2015 Feb;12(2): 143-50). IMPRESSION: 1. No acute intracranial pathology. Moderate age-related atrophy and chronic microvascular ischemic changes. 2. No acute/traumatic cervical spine pathology. Electronically Signed   By: Elgie Collard M.D.   On: 07/28/2020 20:16   CT PELVIS WO CONTRAST  Result Date: 07/28/2020 CLINICAL DATA:  Pelvic pain after a fall.  EXAM: CT PELVIS WITHOUT CONTRAST TECHNIQUE: Multidetector CT imaging of the pelvis was performed following the standard protocol without intravenous contrast. COMPARISON:  None. FINDINGS: Urinary Tract: No bladder wall thickening, stone, or filling defect. Bowel: Stool-filled colon. Colonic diverticula throughout the sigmoid colon without evidence of diverticulitis. Visualized small and large bowel are not abnormally distended. Vascular/Lymphatic: Mild vascular calcifications. No aneurysm appreciated. Reproductive: Uterus appears to be surgically absent. Right adnexal cystic lesion measuring 2.6 cm diameter. Other:  No free air or free fluid in the pelvis. Musculoskeletal: Degenerative changes in the lower lumbar spine. Degenerative changes in the sacral iliac joints. Chronic sclerosis in the SI joints bilaterally. Acute comminuted fractures are demonstrated in the right superior pubic ramus adjacent to the symphysis pubis and in the right inferior pubic ramus. No significant displacement. No pubic diastasis. Hips appear intact. IMPRESSION: 1. Acute comminuted fractures of the right superior and inferior pubic rami. 2. Right adnexal cystic lesion measuring 2.6 cm diameter. No follow-up imaging recommended. Note: This recommendation does not apply to premenarchal patients and to those with increased risk (genetic, family history, elevated tumor markers or other high-risk factors) of ovarian cancer. Reference: JACR 2020 Feb; 17(2):248-254 3. Degenerative changes in the lower lumbar spine and SI joints. 4. Colonic diverticulosis without evidence of diverticulitis. Electronically Signed   By: Burman Nieves M.D.   On: 07/28/2020 20:18   DG Knee Complete 4 Views Left  Result Date: 07/28/2020 CLINICAL DATA:  Left knee pain after a fall. EXAM: LEFT KNEE - COMPLETE 4+ VIEW COMPARISON:  08/21/2007 FINDINGS: High-riding patella although similar to prior study. Degenerative changes in all 3 compartments with lateral  greater than medial compartment narrowing and tricompartment osteophyte formation. Degenerative changes have progressed since the prior study. No acute fracture or dislocation is identified. No significant effusion. Soft tissues are unremarkable. IMPRESSION: Tricompartment degenerative changes in the left knee, progressed since prior study. Patella [place] similar to prior study. No acute bony abnormalities. Electronically Signed   By: Burman Nieves M.D.   On: 07/28/2020 20:21    Scheduled Meds: . (feeding supplement) PROSource Plus  30 mL Oral TID BM  . enoxaparin (LOVENOX) injection  40 mg Subcutaneous Q24H  . feeding supplement  237 mL Oral TID BM  . furosemide  20 mg Oral Daily  .  morphine injection  4 mg Intravenous Once  . multivitamin with minerals  1 tablet Oral Daily  . sodium chloride flush  3 mL Intravenous Q12H    Continuous Infusions: . sodium chloride       LOS: 0 days     Briant Cedar, MD [facility]  If 7PM-7AM, please contact night-coverage [email or web address] 07/29/2020, 1:13 PM

## 2020-07-29 NOTE — ED Notes (Signed)
Patient is resting comfortably. 

## 2020-07-29 NOTE — ED Notes (Signed)
Pt resting in bed, nad noted, vss, warm blanket provided, family at bedside.

## 2020-07-29 NOTE — Discharge Instructions (Signed)
Nutrition Post Hospital Stay °Proper nutrition can help your body recover from illness and injury.   °Foods and beverages high in protein, vitamins, and minerals help rebuild muscle loss, promote healing, & reduce fall risk.  ° °In addition to eating healthy foods, a nutrition shake is an easy, delicious way to get the nutrition you need during and after your hospital stay ° °It is recommended that you continue to drink 3 bottles per day of: Ensure for at least 1 month (30 days) after your hospital stay  ° °Tips for adding a nutrition shake into your routine: °As allowed, drink one with vitamins or medications instead of water or juice °Enjoy one as a tasty mid-morning or afternoon snack °Drink cold or make a milkshake out of it °Drink one instead of milk with cereal or snacks °Use as a coffee creamer °  °Available at the following grocery stores and pharmacies:           °* [employer] * [employer] * [employer]  °* [employer]          * Walmart * [employer]  °* [facility]      * Target  * [employer]J's   °* [facility]  * [employer]   °* [facility] [phone]  °          °For COUPONS visit: www.ensure.com/join or [email or web address]/members/sign-[email or web address]  ° °Suggested Substitutions °Ensure Plus = Boost Plus = Carnation Breakfast Essentials = Boost Compact °Ensure Active Clear = Boost Breeze °Glucerna Shake = Boost Glucose Control = Carnation Breakfast Essentials SUGAR FREE ° °Suggestions For Increasing Calories And Protein °Several small meals a day are easier to eat and digest than three large ones. Space meals about 2 to 3 hours apart to maximize comfort. °Stop eating 2 to 3 hours before bed and sleep with your head elevated if gastric reflux (GERD) and heartburn are problems. °Do not eat your favorite foods if you are feeling bad. Save them for when you feel good! °Eat breakfast-type foods at any meal. Eggs are usually easy to eat and are great any time of the day. (The same goes for pancakes and waffles.) °Eat when you  feel hungry. Most people have the greatest appetite in the morning because they have not eaten all night. If this is the best meal for you, then pile on those calories and other nutrients in the morning and at lunch. Then you can have a smaller dinner without losing total calories for the day. °Eat leftovers or nutritious snacks in the afternoon and early evening to round out your day. °Try homemade or commercially prepared nutrition bars and puddings, as well as calorie- and protein-rich liquid nutritional supplements. °Benefits of Physical Activity °Talk to your doctor about physical activity. Light or moderate physical activity can help maintain muscle and promote an appetite. Walking in the neighborhood or the local mall is a great way to get up, get out, and get moving. If you are unsteady on your feet, try walking around the dining room table. °Save Room for Calorie-Rich Food! °Drink most fluids between meals instead of with meals. (It is fine to have a sip to help swallow food at meal time.) Fluids (which usually have fewer calories and nutrients than solid food) can take up valuable space in your stomach. ° °Foods Recommended °High-Protein Foods °Milk products Add cheese to toast, crackers, sandwiches, baked potatoes, vegetables, soups, noodles, meat, and fruit. Use reduced-fat (2%) or whole milk in place of water   when cooking cereal and cream soups. Include cream sauces on vegetables and pasta. Add powdered milk to cream soups and mashed potatoes.  °Eggs Have hard-cooked eggs readily available in the refrigerator. Chop and add to salads, casseroles, soups, and vegetables. Make a quick egg salad. All eggs should be well cooked to avoid the risk of harmful bacteria.  °Meats, poultry, and fish Add leftover cooked meats to soups, casseroles, salads, and omelets. Make dip by mixing diced, chopped, or shredded meat with sour cream and spices.  °Beans, legumes, nuts, and seeds Sprinkle nuts and seeds on cereals,  fruit, and desserts such as ice cream, pudding, and custard. Also serve nuts and seeds on vegetables, salads, and pasta. Spread peanut butter on toast, bread, English muffins, and fruit, or blend it in a milk shake. Add beans and peas to salads, soups, casseroles, and vegetable dishes.  °High-Calorie Foods °Butter, margarine, and  oils Melt butter or margarine over potatoes, rice, pasta, and cooked vegetables. Add melted butter or margarine into soups and casseroles and spread on bread for sandwiches before spreading sandwich spread or peanut butter. Sauté or stir-fry vegetables, meats, chicken and fish such as shrimp/scallops in olive or canola oil. A variety of oils add calories and can be used to marinate meat, chicken, or fish.  °Milk products Add whipping cream to desserts, pancakes, waffles, fruit, and hot chocolate, and fold it into soups and casseroles. Add sour cream to baked potatoes and vegetables.  °Salad dressing Use regular (not low-fat or diet) mayonnaise and salad dressing on sandwiches and in dips with vegetables and fruit.   °Sweets Add jelly and honey to bread and crackers. Add jam to fruit and ice cream and as a topping over cake.  ° °Copyright 2020 © Academy of [employer] and Dietetics. All rights reserved.  °

## 2020-07-29 NOTE — Consult Note (Signed)
ORTHOPAEDIC CONSULTATION  REQUESTING PHYSICIAN: Alma Friendly, MD  Chief Complaint: hip pain after a fall  HPI: Roberta [name] is a 87 y.o. female who complains of left hip pain after a fall at home. She apparently has had 3 falls since February. She claims that she was on the phone walking from one room of her home to another when her legs gave out on her. She had immediate pain and was unable to bear weight on either leg. She was able to crawl to the phone to call for help.   Imaging shows acute comminuted fractures of the right superior and inferior pubic rami .    Orthopedics was consulted for evaluation.   No history of MI, CVA, DVT, PE.  Previously ambulatory. Supposed to use walker but doesn't like to.  The patient is living in a senior living community.    Past Medical History:  Diagnosis Date  . Anemia   . Cervical disc disease   . Glaucoma   . Hypercalcemia   . Hypercholesteremia   . Osteoporosis 2/15   Started Fosamax  . Thyroid disease    Hyperparathyroid  . Venous insufficiency    History reviewed. No pertinent surgical history. Social History   Socioeconomic History  . Marital status: Widowed    Spouse name: Not on file  . Number of children: Not on file  . Years of education: Not on file  . Highest education level: Not on file  Occupational History  . Not on file  Tobacco Use  . Smoking status: Never Smoker  . Smokeless tobacco: Never Used  Substance and Sexual Activity  . Alcohol use: No  . Drug use: No  . Sexual activity: Not on file  Other Topics Concern  . Not on file  Social History Narrative  . Not on file   Social Determinants of Health   Financial Resource Strain: Not on file  Food Insecurity: Not on file  Transportation Needs: Not on file  Physical Activity: Not on file  Stress: Not on file  Social Connections: Not on file   History reviewed. No pertinent family history. Allergies  Allergen Reactions  . Alendronate  Sodium Other (See Comments)   Prior to Admission medications   Medication Sig Start Date End Date Taking? Authorizing Provider  Artificial Tear Solution (SOOTHE XP) SOLN Place 1 drop into both eyes daily as needed (dry eyes).   Yes [provider]  Calcium Carb-Cholecalciferol 600-200 MG-UNIT TABS Take 1 tablet by mouth daily.   Yes [provider]  denosumab (PROLIA) 60 MG/ML SOSY injection Inject 60 mg into the skin every 6 (six) months.   Yes [provider]  Multiple Vitamin (MULTIVITAMIN) tablet Take 1 tablet by mouth daily.   Yes [provider]  naproxen sodium (ALEVE) 220 MG tablet Take 220 mg by mouth daily as needed (pain).   Yes [provider]  Trolamine Salicylate (ASPERCREME) 10 % LOTN Apply 1 application topically daily.   Yes [provider]   DG Chest 1 View  Result Date: 07/28/2020 CLINICAL DATA:  Left knee pain after a fall. EXAM: CHEST  1 VIEW COMPARISON:  08/21/2007 FINDINGS: The heart size and mediastinal contours are within normal limits. Both lungs are clear. The visualized skeletal structures are unremarkable. IMPRESSION: No active disease. Electronically Signed   By: Lucienne Capers M.D.   On: 07/28/2020 20:20   CT Head Wo Contrast  Result Date: 07/28/2020 CLINICAL DATA:  87 year old  female with facial trauma. EXAM: CT HEAD WITHOUT CONTRAST CT CERVICAL SPINE WITHOUT CONTRAST TECHNIQUE: Multidetector CT imaging of the head and cervical spine was performed following the standard protocol without intravenous contrast. Multiplanar CT image reconstructions of the cervical spine were also generated. COMPARISON:  None. FINDINGS: CT HEAD FINDINGS Brain: There is moderate age-related atrophy and chronic microvascular ischemic changes. There is no acute intracranial hemorrhage. No mass effect or midline shift no extra-axial fluid collection. Vascular: No hyperdense vessel or unexpected calcification. Skull: Normal. Negative for  fracture or focal lesion. Sinuses/Orbits: No acute finding. Other: None CT CERVICAL SPINE FINDINGS Alignment: No acute subluxation. Skull base and vertebrae: No acute fracture. Soft tissues and spinal canal: No prevertebral fluid or swelling. No visible canal hematoma. Disc levels:  Multilevel degenerative changes. No acute findings. Upper chest: Negative. Other: Heterogeneous thyroid gland with several nodules. In the setting of significant comorbidities or limited life expectancy, no follow-up recommended (ref: J Am Coll Radiol. 2015 Feb;12(2): 143-50). IMPRESSION: 1. No acute intracranial pathology. Moderate age-related atrophy and chronic microvascular ischemic changes. 2. No acute/traumatic cervical spine pathology. Electronically Signed   By: Anner Crete M.D.   On: 07/28/2020 20:16   CT Cervical Spine Wo Contrast  Result Date: 07/28/2020 CLINICAL DATA:  87 year old female with facial trauma. EXAM: CT HEAD WITHOUT CONTRAST CT CERVICAL SPINE WITHOUT CONTRAST TECHNIQUE: Multidetector CT imaging of the head and cervical spine was performed following the standard protocol without intravenous contrast. Multiplanar CT image reconstructions of the cervical spine were also generated. COMPARISON:  None. FINDINGS: CT HEAD FINDINGS Brain: There is moderate age-related atrophy and chronic microvascular ischemic changes. There is no acute intracranial hemorrhage. No mass effect or midline shift no extra-axial fluid collection. Vascular: No hyperdense vessel or unexpected calcification. Skull: Normal. Negative for fracture or focal lesion. Sinuses/Orbits: No acute finding. Other: None CT CERVICAL SPINE FINDINGS Alignment: No acute subluxation. Skull base and vertebrae: No acute fracture. Soft tissues and spinal canal: No prevertebral fluid or swelling. No visible canal hematoma. Disc levels:  Multilevel degenerative changes. No acute findings. Upper chest: Negative. Other: Heterogeneous thyroid gland with several  nodules. In the setting of significant comorbidities or limited life expectancy, no follow-up recommended (ref: J Am Coll Radiol. 2015 Feb;12(2): 143-50). IMPRESSION: 1. No acute intracranial pathology. Moderate age-related atrophy and chronic microvascular ischemic changes. 2. No acute/traumatic cervical spine pathology. Electronically Signed   By: Anner Crete M.D.   On: 07/28/2020 20:16   CT PELVIS WO CONTRAST  Result Date: 07/28/2020 CLINICAL DATA:  Pelvic pain after a fall. EXAM: CT PELVIS WITHOUT CONTRAST TECHNIQUE: Multidetector CT imaging of the pelvis was performed following the standard protocol without intravenous contrast. COMPARISON:  None. FINDINGS: Urinary Tract: No bladder wall thickening, stone, or filling defect. Bowel: Stool-filled colon. Colonic diverticula throughout the sigmoid colon without evidence of diverticulitis. Visualized small and large bowel are not abnormally distended. Vascular/Lymphatic: Mild vascular calcifications. No aneurysm appreciated. Reproductive: Uterus appears to be surgically absent. Right adnexal cystic lesion measuring 2.6 cm diameter. Other:  No free air or free fluid in the pelvis. Musculoskeletal: Degenerative changes in the lower lumbar spine. Degenerative changes in the sacral iliac joints. Chronic sclerosis in the SI joints bilaterally. Acute comminuted fractures are demonstrated in the right superior pubic ramus adjacent to the symphysis pubis and in the right inferior pubic ramus. No significant displacement. No pubic diastasis. Hips appear intact. IMPRESSION: 1. Acute comminuted fractures of the right superior and inferior pubic rami. 2. Right adnexal cystic  lesion measuring 2.6 cm diameter. No follow-up imaging recommended. Note: This recommendation does not apply to premenarchal patients and to those with increased risk (genetic, family history, elevated tumor markers or other high-risk factors) of ovarian cancer. Reference: JACR 2020 Feb;  17(2):248-254 3. Degenerative changes in the lower lumbar spine and SI joints. 4. Colonic diverticulosis without evidence of diverticulitis. Electronically Signed   By: Lucienne Capers M.D.   On: 07/28/2020 20:18   DG Knee Complete 4 Views Left  Result Date: 07/28/2020 CLINICAL DATA:  Left knee pain after a fall. EXAM: LEFT KNEE - COMPLETE 4+ VIEW COMPARISON:  08/21/2007 FINDINGS: High-riding patella although similar to prior study. Degenerative changes in all 3 compartments with lateral greater than medial compartment narrowing and tricompartment osteophyte formation. Degenerative changes have progressed since the prior study. No acute fracture or dislocation is identified. No significant effusion. Soft tissues are unremarkable. IMPRESSION: Tricompartment degenerative changes in the left knee, progressed since prior study. Patella [place] similar to prior study. No acute bony abnormalities. Electronically Signed   By: Lucienne Capers M.D.   On: 07/28/2020 20:21    Positive ROS: All other systems have been reviewed and were otherwise negative with the exception of those mentioned in the HPI and as above.  Objective: Labs cbc Recent Labs    07/28/20 1915 07/28/20 2138  WBC 10.0  --   HGB 10.3* 10.5*  HCT 32.4* 31.0*  PLT 145*  --     Labs inflam No results for input(s): CRP in the last 72 hours.  Invalid input(s): ESR  Labs coag No results for input(s): INR, PTT in the last 72 hours.  Invalid input(s): PT  Recent Labs    07/28/20 2130 07/28/20 2138 07/29/20 0433  NA 142 139 142  K 4.1 4.0 3.9  CL 111 107 110  CO2 26  --  25  GLUCOSE 115* 110* 109*  BUN 25* 23 21  CREATININE 0.88 0.80 0.69  CALCIUM 10.4*  --  10.1    Physical Exam: Vitals:   07/29/20 0220 07/29/20 0513  BP: (!) 161/56 120/70  Pulse: 94 88  Resp: 16 16  Temp: 98 F (36.7 C) 97.7 F (36.5 C)  SpO2: 100% 100%   General: Alert, no acute distress. Sitting up in bed Mental status: Alert and Oriented  x3 Neurologic: Speech Clear and organized, no gross focal findings or movement disorder appreciated. Slightly confused Respiratory: No cyanosis, no use of accessory musculature Cardiovascular: RRR GI: Abdomen is soft and non-tender, non-distended. Skin: Warm and dry.  No lesions in the area of chief complaint. Extremities: Warm and well perfused. B/L lower leg 1+ pitting edema. Vascular insufficiency seen  Psychiatric: Patient is competent for consent with normal mood and affect  MUSCULOSKELETAL:  B/L hips are non-TTP. No swelling, ecchymosis, or obvious deformity noted. ROM intact. NVI Other extremities are atraumatic with painless ROM and NVI.  Assessment / Plan: Active Problems:   Pelvic fracture (HCC)  Treat non-operatively Work with PT on mobilization Patient says her PCP is not aware of the lower leg edema that has been present for 1-2 months She's a little confused while talking to me   Weightbearing: WBAT RLE Insicional and dressing care: none Orthopedic device(s): None Showering: ok VTE prophylaxis: Lovenox 42m qd while inpatient. Can switch to ASA 871mbid once d/c Pain control: Continue current regimen. Try to minimize narcotics Follow - up plan: 2 weeks in the office Contact information:  TiEdmonia LynchD, MeAggie MoatsA-C  Britt Bottom PA-C Office [phone] 07/29/2020 7:53 AM

## 2020-07-30 ENCOUNTER — Inpatient Hospital Stay (HOSPITAL_COMMUNITY): Payer: PPO

## 2020-07-30 DIAGNOSIS — R600 Localized edema: Secondary | ICD-10-CM

## 2020-07-30 DIAGNOSIS — I5032 Chronic diastolic (congestive) heart failure: Secondary | ICD-10-CM

## 2020-07-30 DIAGNOSIS — E43 Unspecified severe protein-calorie malnutrition: Secondary | ICD-10-CM

## 2020-07-30 DIAGNOSIS — S32301D Unspecified fracture of right ilium, subsequent encounter for fracture with routine healing: Secondary | ICD-10-CM

## 2020-07-30 LAB — ECHOCARDIOGRAM COMPLETE
Area-P 1/2: 3.31 cm2
Height: 62 in
P 1/2 time: 379 msec
S' Lateral: 2.1 cm
Weight: 1763.68 oz

## 2020-07-30 LAB — CBC WITH DIFFERENTIAL/PLATELET
Abs Immature Granulocytes: 0.03 10*3/uL (ref 0.00–0.07)
Basophils Absolute: 0 10*3/uL (ref 0.0–0.1)
Basophils Relative: 0 %
Eosinophils Absolute: 0.2 10*3/uL (ref 0.0–0.5)
Eosinophils Relative: 2 %
HCT: 32.8 % — ABNORMAL LOW (ref 36.0–46.0)
Hemoglobin: 10.7 g/dL — ABNORMAL LOW (ref 12.0–15.0)
Immature Granulocytes: 0 %
Lymphocytes Relative: 18 %
Lymphs Abs: 2.2 10*3/uL (ref 0.7–4.0)
MCH: 31.4 pg (ref 26.0–34.0)
MCHC: 32.6 g/dL (ref 30.0–36.0)
MCV: 96.2 fL (ref 80.0–100.0)
Monocytes Absolute: 0.8 10*3/uL (ref 0.1–1.0)
Monocytes Relative: 7 %
Neutro Abs: 8.6 10*3/uL — ABNORMAL HIGH (ref 1.7–7.7)
Neutrophils Relative %: 73 %
Platelets: 235 10*3/uL (ref 150–400)
RBC: 3.41 MIL/uL — ABNORMAL LOW (ref 3.87–5.11)
RDW: 13 % (ref 11.5–15.5)
WBC: 11.7 10*3/uL — ABNORMAL HIGH (ref 4.0–10.5)
nRBC: 0 % (ref 0.0–0.2)

## 2020-07-30 LAB — FERRITIN: Ferritin: 45 ng/mL (ref 11–307)

## 2020-07-30 LAB — BASIC METABOLIC PANEL
Anion gap: 8 (ref 5–15)
BUN: 30 mg/dL — ABNORMAL HIGH (ref 8–23)
CO2: 26 mmol/L (ref 22–32)
Calcium: 10.2 mg/dL (ref 8.9–10.3)
Chloride: 106 mmol/L (ref 98–111)
Creatinine, Ser: 0.82 mg/dL (ref 0.44–1.00)
GFR, Estimated: >60 mL/min (ref >60)
Glucose, Bld: 133 mg/dL — ABNORMAL HIGH (ref 70–99)
Potassium: 3.6 mmol/L (ref 3.5–5.1)
Sodium: 140 mmol/L (ref 135–145)

## 2020-07-30 LAB — IRON AND TIBC
Iron: 21 ug/dL — ABNORMAL LOW (ref 28–170)
Saturation Ratios: 7 % — ABNORMAL LOW (ref 10.4–31.8)
TIBC: 295 ug/dL (ref 250–450)
UIBC: 274 ug/dL

## 2020-07-30 LAB — VITAMIN B12: Vitamin B-12: 695 pg/mL (ref 180–914)

## 2020-07-30 LAB — FOLATE: Folate: 18.7 ng/mL (ref >5.9)

## 2020-07-30 MED ORDER — PROSOURCE PLUS PO LIQD: 30.0000 mL | Freq: Three times a day (TID) | ORAL | 0 refills | Status: AC

## 2020-07-30 MED ORDER — ACETAMINOPHEN 500 MG PO TABS: 500.0000 mg | ORAL_TABLET | Freq: Four times a day (QID) | ORAL | 0 refills | Status: AC | PRN

## 2020-07-30 MED ORDER — ASPIRIN EC 81 MG PO TBEC: 81.0000 mg | DELAYED_RELEASE_TABLET | Freq: Two times a day (BID) | ORAL | 0 refills | Status: AC

## 2020-07-30 MED ORDER — FERROUS SULFATE 325 (65 FE) MG PO TABS
325.0000 mg | ORAL_TABLET | Freq: Every day | ORAL | Status: DC
  Administered 2020-07-30: 325 mg via ORAL
  Filled 2020-07-30: qty 1

## 2020-07-30 MED ORDER — FERROUS SULFATE 325 (65 FE) MG PO TABS: 325.0000 mg | ORAL_TABLET | Freq: Every day | ORAL | 0 refills | Status: AC

## 2020-07-30 MED ORDER — HYDROCODONE-ACETAMINOPHEN 5-325 MG PO TABS: 1.0000 | ORAL_TABLET | Freq: Three times a day (TID) | ORAL | 0 refills | Status: AC | PRN

## 2020-07-30 MED ORDER — FUROSEMIDE 20 MG PO TABS: 20.0000 mg | ORAL_TABLET | Freq: Every day | ORAL | 0 refills | Status: AC

## 2020-07-30 NOTE — Progress Notes (Signed)
  Echocardiogram 2D Echocardiogram has been performed.  Roberta [name] 07/30/2020, 1:58 PM

## 2020-07-30 NOTE — TOC Initial Note (Signed)
Transition of Care [facility]) - Initial/Assessment Note   Patient Details  Name: Roberta [name] MRN: [id number] Date of Birth: 11-09-1929  Transition of [facility] CM/SW Contact:    Sherie Don, LCSW Phone Number: 07/30/2020, 1:55 PM  Clinical Narrative: Patient is an 87 year old female who was admitted for a pelvic fracture. PT and OT evaluations recommended HHPT and HHOT. CSW met with patient and daughter to discuss recommendations. Patient and daughter confirmed patient lives at [facility] independent living facility and will be able to receive [facility] onsite. Patient and daughter do not have a preference for [facility] agencies. Patient has a rolling walker and rollator at home, so there are no DME needs at this time. [place] referral made to [facility] with [facility]. Orders placed. Patient updated. HH added to AVS.  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 ([place]) Date [place]: 07/30/20 Time [place]: 1157 Representative spoke with at [place]: [place] Arrangements/Services Living arrangements for the past 2 months: [place] Lives with:: Self Patient language and need for interpreter reviewed:: Yes Do you feel safe going back to the place where you live?: Yes       Need for Family Participation in Patient Care: No (Comment) Care giver support system in place?: Yes (comment) Current home services: DME (Rolling walker, rollator) Criminal Activity/Legal Involvement Pertinent to Current Situation/Hospitalization: No - Comment as needed  Activities of Daily Living Home Assistive Devices/Equipment: Engineer, drilling (specify type) (reading glasses) ADL Screening (condition at time of admission) Patient's cognitive ability adequate to safely complete daily activities?: Yes Is the patient deaf or have difficulty hearing?: No Does the patient have difficulty seeing, even when wearing glasses/contacts?: No (wears reading glasses) Does the patient have difficulty concentrating, remembering, or making decisions?: Yes (forgetful at times) Patient able to express need for assistance with ADLs?: Yes Does the patient have difficulty dressing or bathing?: Yes Independently performs ADLs?: No Communication: Independent Dressing (OT): Needs assistance Is this a change from baseline?: Change from baseline, expected to last >3 days Grooming: Independent Feeding: Independent Bathing: Needs assistance Is this a change from baseline?: Change from baseline, expected to last >3 days Toileting: Needs assistance Is this a change from baseline?: Change from baseline, expected to last >3days In/Out Bed: Needs assistance Is this a change from baseline?: Change from baseline, expected to last >3 days Walks in Home: Needs assistance Is this a change from baseline?: Change from baseline, expected to last >3 days Does the patient have difficulty walking or climbing stairs?: Yes Weakness of Legs: Both Weakness of Arms/Hands: None  Permission Sought/Granted Permission sought to share information with : Other (comment) Permission granted to share information with : Yes, Verbal Permission Granted Permission granted to share info w AGENCY: [place] agencies  Emotional  Assessment Appearance:: Appears stated age  Attitude/Demeanor/Rapport: Engaged Affect (typically observed): Accepting Orientation: : Oriented to Self,Oriented to Place,Oriented to  Time,Oriented to Situation Alcohol / Substance Use: Not Applicable Psych Involvement: No (comment)  Admission diagnosis:  Pelvic fracture ([place]) [S32.9XXA] Closed nondisplaced fracture of pelvis, unspecified part of pelvis, initial encounter ([place]) [S32.9XXA] Patient Active Problem List   Diagnosis Date Noted  . Pelvic fracture ([place]) 07/29/2020  . Protein-calorie malnutrition, severe 07/29/2020  . Lower extremity edema   . Debility   . Hypercholesteremia 01/01/2014   PCP:  Lajean Manes, MD Pharmacy:   [facility], [address] [place] [address] Phone: [phone] Fax: [phone]  Readmission Risk Interventions No flowsheet data found.

## 2020-07-30 NOTE — Discharge Summary (Addendum)
Discharge Summary  Roberta [name]:[id number] DOB: 04/29/1929  PCP: [name], MD  Admit date: 07/28/2020 Discharge date: 07/30/2020  Time spent: 35 mins  Recommendations for Outpatient Follow-up:  1. PCP in 1 week 2. Orthopedics in 2 weeks  Discharge Diagnoses:  Active Hospital Problems   Diagnosis Date Noted  . Pelvic fracture (HCC) 07/29/2020  . Protein-calorie malnutrition, severe 07/29/2020    Resolved Hospital Problems  No resolved problems to display.    Discharge Condition: Stable  Diet recommendation: Heart healthy  Vitals:   07/29/20 2210 07/30/20 0520  BP: 126/66 128/61  Pulse: 96 93  Resp: 16 16  Temp: 98.4 F (36.9 C) 98.6 F (37 C)  SpO2: 94% 98%    History of present illness:  87 y.o.year-old w/ hx of anemia, DJD, HL who presented after a fall. She was walking when her "legs gave out" and she fell onto her back. She had left hip pain. She crawled to the phone and called EMS. She could not stand. Of note, pt has had about 3 falls since this year.In ED vital signs were stable. CT pelvis showed R superior and inferior pubic ramus fractures and CT head, C-spine were negative. ED MD called DR Eulah Pont w/ orthopedics who recommended pain meds and will consult. Pt lives in a senior apartment for many years. Pt has no significant medical problems. Noted BLE edema, with some superficial wounds. Per daughter, has been going on for 1- 2 months. No hx of CHF. Not taking any Rx medications, except for denosumab every 6 mos. Patient admitted for further management.    Today, patient denies any new complaints, denies any worsening left hip/low back pain.  Patient was able to ambulate with a walker around her room with PT/OT.  Denies any chest pain, shortness of breath, abdominal pain, nausea/vomiting, fever/chills.  Discussed over the phone with daughter about discharge plans and follow-up appointments.    Hospital Course:  Active Problems:   Pelvic  fracture (HCC)   Protein-calorie malnutrition, severe   Acute comminuted R superior and inferior pubic ramus fracture Mechanical fall Hx of recurrent fall CT pelvis showed above Orthopedics consulted, nonsurgical management recommended, follow up outpt in 2 weeks Pain management HH PT/OT  Leukocytosis Afebrile Likely reactive, no obvious signs of infection Follow up with PCP  Chronic diastolic HF Noted BLE edema, denies any SOB BNP 74 Chest x-ray, no active disease Echo showed EF of 60-65%, no RWMA, grade 1 DD Discharge on Lasix 20 mg daily Follow up with PCP  Iron def anemia Anemia panel showed iron 21, sats 7, folate 18.7, Vitamin b12 695 Oral iron supplementation Follow-up with PCP  Hx of osteoporosis Continue denosumab    Malnutrition Type:  Nutrition Problem: Severe Malnutrition Etiology: chronic illness   Malnutrition Characteristics:  Signs/Symptoms: severe fat depletion,moderate muscle depletion,severe muscle depletion,energy intake < or equal to 75% for > or equal to 1 month   Nutrition Interventions:  Interventions: Ensure Enlive (each supplement provides 350kcal and 20 grams of protein),MVI,Prostat   Estimated body mass index is 20.16 kg/m as calculated from the following:   Height as of this encounter: 5\' 2"  (1.575 m).   Weight as of this encounter: 50 kg.    Procedures:  None  Consultations:  Orthopedics    Discharge Exam: BP 128/61 (BP Location: Left Arm)   Pulse 93   Temp 98.6 F (37 C) (Oral)   Resp 16   Ht 5\' 2"  (1.575 m)   Wt 50  kg   SpO2 98%   BMI 20.16 kg/m    General: NAD Cardiovascular: S1, S2 present Respiratory: CTAB    Discharge Instructions You were cared for by a hospitalist during your hospital stay. If you have any questions about your discharge medications or the care you received while you were in the hospital after you are discharged, you can call the unit and asked to speak with the hospitalist on  call if the hospitalist that took care of you is not available. Once you are discharged, your primary care physician will handle any further medical issues. Please note that NO REFILLS for any discharge medications will be authorized once you are discharged, as it is imperative that you return to your primary care physician (or establish a relationship with a primary care physician if you do not have one) for your aftercare needs so that they can reassess your need for medications and monitor your lab values.  Discharge Instructions    Diet - low sodium heart healthy   Complete by: As directed    Increase activity slowly   Complete by: As directed      Allergies as of 07/30/2020      Reactions   Alendronate Sodium Other (See Comments)      Medication List    STOP taking these medications   naproxen sodium 220 MG tablet Commonly known as: ALEVE     TAKE these medications   (feeding supplement) PROSource Plus liquid Take 30 mLs by mouth 3 (three) times daily between meals.   acetaminophen 500 MG tablet Commonly known as: TYLENOL Take 1 tablet (500 mg total) by mouth every 6 (six) hours as needed for mild pain.   Aspercreme 10 % Lotn Generic drug: Trolamine Salicylate Apply 1 application topically daily.   aspirin EC 81 MG tablet Take 1 tablet (81 mg total) by mouth 2 (two) times daily. Swallow whole.   Calcium Carb-Cholecalciferol 600-200 MG-UNIT Tabs Take 1 tablet by mouth daily.   denosumab 60 MG/ML Sosy injection Commonly known as: PROLIA Inject 60 mg into the skin every 6 (six) months.   ferrous sulfate 325 (65 FE) MG tablet Take 1 tablet (325 mg total) by mouth daily with breakfast. Start taking on: July 31, 2020   furosemide 20 MG tablet Commonly known as: LASIX Take 1 tablet (20 mg total) by mouth daily. Start taking on: July 31, 2020   HYDROcodone-acetaminophen 5-325 MG tablet Commonly known as: NORCO/VICODIN Take 1 tablet by mouth every 8 (eight) hours as  needed for up to 5 days for moderate pain.   multivitamin tablet Take 1 tablet by mouth daily.   Soothe XP Soln Place 1 drop into both eyes daily as needed (dry eyes).      Allergies  Allergen Reactions  . Alendronate Sodium Other (See Comments)    Follow-up Information    [facility].   Specialty: Home Health Services Why: PT and OT       Merlene LaughterStoneking, Hal, MD. Schedule an appointment as soon as possible for a visit in 1 week(s).   Specialty: Internal Medicine Contact information: [address] [place] [address] [phone]        Sheral ApleyMurphy, Timothy D, MD. Call today.   Specialty: Orthopedic Surgery Why: Call to make a follow up appointment with orthopedics Contact information: [address] [place] [phone]  The results of significant diagnostics from this hospitalization (including imaging, microbiology, ancillary and laboratory) are listed below for reference.    Significant Diagnostic Studies: DG Chest 1 View  Result Date: 07/28/2020 CLINICAL DATA:  Left knee pain after a fall. EXAM: CHEST  1 VIEW COMPARISON:  08/21/2007 FINDINGS: The heart size and mediastinal contours are within normal limits. Both lungs are clear. The visualized skeletal structures are unremarkable. IMPRESSION: No active disease. Electronically Signed   By: Burman Nieves M.D.   On: 07/28/2020 20:20   CT Head Wo Contrast  Result Date: 07/28/2020 CLINICAL DATA:  87 year old female with facial trauma. EXAM: CT HEAD WITHOUT CONTRAST CT CERVICAL SPINE WITHOUT CONTRAST TECHNIQUE: Multidetector CT imaging of the head and cervical spine was performed following the standard protocol without intravenous contrast. Multiplanar CT image reconstructions of the cervical spine were also generated. COMPARISON:  None. FINDINGS: CT HEAD FINDINGS Brain: There is moderate age-related atrophy and chronic  microvascular ischemic changes. There is no acute intracranial hemorrhage. No mass effect or midline shift no extra-axial fluid collection. Vascular: No hyperdense vessel or unexpected calcification. Skull: Normal. Negative for fracture or focal lesion. Sinuses/Orbits: No acute finding. Other: None CT CERVICAL SPINE FINDINGS Alignment: No acute subluxation. Skull base and vertebrae: No acute fracture. Soft tissues and spinal canal: No prevertebral fluid or swelling. No visible canal hematoma. Disc levels:  Multilevel degenerative changes. No acute findings. Upper chest: Negative. Other: Heterogeneous thyroid gland with several nodules. In the setting of significant comorbidities or limited life expectancy, no follow-up recommended (ref: J Am Coll Radiol. 2015 Feb;12(2): 143-50). IMPRESSION: 1. No acute intracranial pathology. Moderate age-related atrophy and chronic microvascular ischemic changes. 2. No acute/traumatic cervical spine pathology. Electronically Signed   By: Elgie Collard M.D.   On: 07/28/2020 20:16   CT Cervical Spine Wo Contrast  Result Date: 07/28/2020 CLINICAL DATA:  87 year old female with facial trauma. EXAM: CT HEAD WITHOUT CONTRAST CT CERVICAL SPINE WITHOUT CONTRAST TECHNIQUE: Multidetector CT imaging of the head and cervical spine was performed following the standard protocol without intravenous contrast. Multiplanar CT image reconstructions of the cervical spine were also generated. COMPARISON:  None. FINDINGS: CT HEAD FINDINGS Brain: There is moderate age-related atrophy and chronic microvascular ischemic changes. There is no acute intracranial hemorrhage. No mass effect or midline shift no extra-axial fluid collection. Vascular: No hyperdense vessel or unexpected calcification. Skull: Normal. Negative for fracture or focal lesion. Sinuses/Orbits: No acute finding. Other: None CT CERVICAL SPINE FINDINGS Alignment: No acute subluxation. Skull base and vertebrae: No acute fracture. Soft  tissues and spinal canal: No prevertebral fluid or swelling. No visible canal hematoma. Disc levels:  Multilevel degenerative changes. No acute findings. Upper chest: Negative. Other: Heterogeneous thyroid gland with several nodules. In the setting of significant comorbidities or limited life expectancy, no follow-up recommended (ref: J Am Coll Radiol. 2015 Feb;12(2): 143-50). IMPRESSION: 1. No acute intracranial pathology. Moderate age-related atrophy and chronic microvascular ischemic changes. 2. No acute/traumatic cervical spine pathology. Electronically Signed   By: Elgie Collard M.D.   On: 07/28/2020 20:16   CT PELVIS WO CONTRAST  Result Date: 07/28/2020 CLINICAL DATA:  Pelvic pain after a fall. EXAM: CT PELVIS WITHOUT CONTRAST TECHNIQUE: Multidetector CT imaging of the pelvis was performed following the standard protocol without intravenous contrast. COMPARISON:  None. FINDINGS: Urinary Tract: No bladder wall thickening, stone, or filling defect. Bowel: Stool-filled colon. Colonic diverticula throughout the sigmoid colon without evidence of diverticulitis. Visualized small and large bowel are not abnormally distended. Vascular/Lymphatic:  Mild vascular calcifications. No aneurysm appreciated. Reproductive: Uterus appears to be surgically absent. Right adnexal cystic lesion measuring 2.6 cm diameter. Other:  No free air or free fluid in the pelvis. Musculoskeletal: Degenerative changes in the lower lumbar spine. Degenerative changes in the sacral iliac joints. Chronic sclerosis in the SI joints bilaterally. Acute comminuted fractures are demonstrated in the right superior pubic ramus adjacent to the symphysis pubis and in the right inferior pubic ramus. No significant displacement. No pubic diastasis. Hips appear intact. IMPRESSION: 1. Acute comminuted fractures of the right superior and inferior pubic rami. 2. Right adnexal cystic lesion measuring 2.6 cm diameter. No follow-up imaging recommended. Note:  This recommendation does not apply to premenarchal patients and to those with increased risk (genetic, family history, elevated tumor markers or other high-risk factors) of ovarian cancer. Reference: JACR 2020 Feb; 17(2):248-254 3. Degenerative changes in the lower lumbar spine and SI joints. 4. Colonic diverticulosis without evidence of diverticulitis. Electronically Signed   By: Burman Nieves M.D.   On: 07/28/2020 20:18   DG Knee Complete 4 Views Left  Result Date: 07/28/2020 CLINICAL DATA:  Left knee pain after a fall. EXAM: LEFT KNEE - COMPLETE 4+ VIEW COMPARISON:  08/21/2007 FINDINGS: High-riding patella although similar to prior study. Degenerative changes in all 3 compartments with lateral greater than medial compartment narrowing and tricompartment osteophyte formation. Degenerative changes have progressed since the prior study. No acute fracture or dislocation is identified. No significant effusion. Soft tissues are unremarkable. IMPRESSION: Tricompartment degenerative changes in the left knee, progressed since prior study. Patella [place]ta similar to prior study. No acute bony abnormalities. Electronically Signed   By: Burman Nieves M.D.   On: 07/28/2020 20:21   ECHOCARDIOGRAM COMPLETE  Result Date: 07/30/2020    ECHOCARDIOGRAM REPORT   Patient Name:   Roberta [name] Date of Exam: 07/30/2020 Medical Rec #:  [id number]       Height:       62.0 in Accession #:    [id number]      Weight:       110.2 lb Date of Birth:  1929-07-03       BSA:          1.484 m Patient Age:    87 years        BP:           128/61 mmHg Patient Gender: F               HR:           93 bpm. Exam Location:  Inpatient Procedure: 2D Echo Indications:    Lower extremity Edema  History:        Patient has no prior history of Echocardiogram examinations.  Sonographer:    Thurman Coyer RDCS (AE) Referring Phys: [id number] ROBERT SCHERTZ IMPRESSIONS  1. Left ventricular ejection fraction, by estimation, is 60 to 65%. The left  ventricle has normal function. The left ventricle has no regional wall motion abnormalities. Left ventricular diastolic parameters are consistent with Grade I diastolic dysfunction (impaired relaxation). Elevated left ventricular end-diastolic pressure.  2. Right ventricular systolic function is normal. The right ventricular size is normal. There is normal pulmonary artery systolic pressure.  3. The mitral valve is normal in structure. No evidence of mitral valve regurgitation. No evidence of mitral stenosis.  4. The aortic valve is tricuspid. Aortic valve regurgitation is mild. No aortic stenosis is present.  5. The inferior vena cava is normal in size with <50%  respiratory variability, suggesting right atrial pressure of 8 mmHg. FINDINGS  Left Ventricle: Left ventricular ejection fraction, by estimation, is 60 to 65%. The left ventricle has normal function. The left ventricle has no regional wall motion abnormalities. The left ventricular internal cavity size was normal in size. There is  no left ventricular hypertrophy. Left ventricular diastolic parameters are consistent with Grade I diastolic dysfunction (impaired relaxation). Elevated left ventricular end-diastolic pressure. Right Ventricle: The right ventricular size is normal. No increase in right ventricular wall thickness. Right ventricular systolic function is normal. There is normal pulmonary artery systolic pressure. The tricuspid regurgitant velocity is 2.24 m/s, and  with an assumed right atrial pressure of 8 mmHg, the estimated right ventricular systolic pressure is 28.1 mmHg. Left Atrium: Left atrial size was normal in size. Right Atrium: Right atrial size was normal in size. Pericardium: There is no evidence of pericardial effusion. Mitral Valve: The mitral valve is normal in structure. No evidence of mitral valve regurgitation. No evidence of mitral valve stenosis. Tricuspid Valve: The tricuspid valve is normal in structure. Tricuspid valve  regurgitation is mild . No evidence of tricuspid stenosis. Aortic Valve: The aortic valve is tricuspid. Aortic valve regurgitation is mild. Aortic regurgitation PHT measures 379 msec. No aortic stenosis is present. Pulmonic Valve: The pulmonic valve was normal in structure. Pulmonic valve regurgitation is not visualized. No evidence of pulmonic stenosis. Aorta: The aortic root is normal in size and structure. Venous: The inferior vena cava is normal in size with less than 50% respiratory variability, suggesting right atrial pressure of 8 mmHg. IAS/Shunts: No atrial level shunt detected by color flow Doppler.  LEFT VENTRICLE PLAX 2D LVIDd:         3.30 cm  Diastology LVIDs:         2.10 cm  LV e' medial:    3.81 cm/s LV PW:         1.00 cm  LV E/e' medial:  16.2 LV IVS:        1.10 cm  LV e' lateral:   7.87 cm/s LVOT diam:     2.00 cm  LV E/e' lateral: 7.8 LV SV:         70 LV SV Index:   47 LVOT Area:     3.14 cm  RIGHT VENTRICLE RV S prime:     14.70 cm/s TAPSE (M-mode): 1.2 cm LEFT ATRIUM             Index       RIGHT ATRIUM           Index LA diam:        3.00 cm 2.02 cm/m  RA Area:     12.30 cm LA Vol (A2C):   30.0 ml 20.21 ml/m RA Volume:   23.90 ml  16.10 ml/m LA Vol (A4C):   32.1 ml 21.63 ml/m LA Biplane Vol: 31.7 ml 21.36 ml/m  AORTIC VALVE LVOT Vmax:   117.00 cm/s LVOT Vmean:  81.800 cm/s LVOT VTI:    0.224 m AI PHT:      379 msec  AORTA Ao Root diam: 3.30 cm MITRAL VALVE               TRICUSPID VALVE MV Area (PHT): 3.31 cm    TR Peak grad:   20.1 mmHg MV Decel Time: 229 msec    TR Vmax:        224.00 cm/s MV E velocity: 61.70 cm/s MV A velocity: 93.40 cm/s  SHUNTS MV E/A ratio:  0.66        Systemic VTI:  0.22 m                            Systemic Diam: 2.00 cm Chilton Si MD Electronically signed by Chilton Si MD Signature Date/Time: 07/30/2020/4:13:54 PM    Final     Microbiology: Recent Results (from the past 240 hour(s))  Resp Panel by RT-PCR (Flu A&B, Covid) Nasopharyngeal Swab      Status: None   Collection Time: 07/28/20  7:52 PM   Specimen: Nasopharyngeal Swab; Nasopharyngeal(NP) swabs in vial transport medium  Result Value Ref Range Status   SARS Coronavirus 2 by RT PCR NEGATIVE NEGATIVE Final    Comment: (NOTE) SARS-CoV-2 target nucleic acids are NOT DETECTED.  The SARS-CoV-2 RNA is generally detectable in upper respiratory specimens during the acute phase of infection. The lowest concentration of SARS-CoV-2 viral copies this assay can detect is 138 copies/mL. A negative result does not preclude SARS-Cov-2 infection and should not be used as the sole basis for treatment or other patient management decisions. A negative result may occur with  improper specimen collection/handling, submission of specimen other than nasopharyngeal swab, presence of viral mutation(s) within the areas targeted by this assay, and inadequate number of viral copies(<138 copies/mL). A negative result must be combined with clinical observations, patient history, and epidemiological information. The expected result is Negative.  Fact Sheet for Patients:  BloggerCourse.com  Fact Sheet for Healthcare Providers:  SeriousBroker.it  This test is no t yet approved or cleared by the [place] FDA and  has been authorized for detection and/or diagnosis of SARS-CoV-2 by FDA under an Emergency Use Authorization (EUA). This EUA will remain  in effect (meaning this test can be used) for the duration of the COVID-19 declaration under Section 564(b)(1) of the Act, 21 U.S.C.section 360bbb-3(b)(1), unless the authorization is terminated  or revoked sooner.       Influenza A by PCR NEGATIVE NEGATIVE Final   Influenza B by PCR NEGATIVE NEGATIVE Final    Comment: (NOTE) The Xpert Xpress SARS-CoV-2/FLU/RSV plus assay is intended as an aid in the diagnosis of influenza from Nasopharyngeal swab specimens and should not be used as a sole basis  for treatment. Nasal washings and aspirates are unacceptable for Xpert Xpress SARS-CoV-2/FLU/RSV testing.  Fact Sheet for Patients: BloggerCourse.com  Fact Sheet for Healthcare Providers: SeriousBroker.it  This test is not yet approved or cleared by the [place] FDA and has been authorized for detection and/or diagnosis of SARS-CoV-2 by FDA under an Emergency Use Authorization (EUA). This EUA will remain in effect (meaning this test can be used) for the duration of the COVID-19 declaration under Section 564(b)(1) of the Act, 21 U.S.C. section 360bbb-3(b)(1), unless the authorization is terminated or revoked.  Performed at [facility], [address], [place], [place] [address]      Labs: Basic Metabolic Panel: Recent Labs  Lab 07/28/20 2130 07/28/20 2138 07/29/20 0433 07/30/20 0428  NA 142 139 142 140  K 4.1 4.0 3.9 3.6  CL 111 107 110 106  CO2 26  --  25 26  GLUCOSE 115* 110* 109* 133*  BUN 25* 23 21 30*  CREATININE 0.88 0.80 0.69 0.82  CALCIUM 10.4*  --  10.1 10.2   Liver Function Tests: Recent Labs  Lab 07/28/20 2130  AST 19  ALT 11  ALKPHOS 64  BILITOT 0.5  PROT 6.3*  ALBUMIN 3.6   No results for input(s): LIPASE, AMYLASE in the last 168 hours. No results for input(s): AMMONIA in the last 168 hours. CBC: Recent Labs  Lab 07/28/20 1915 07/28/20 2138 07/30/20 0428  WBC 10.0  --  11.7*  NEUTROABS 6.6  --  8.6*  HGB 10.3* 10.5* 10.7*  HCT 32.4* 31.0* 32.8*  MCV 97.0  --  96.2  PLT 145*  --  235   Cardiac Enzymes: No results for input(s): CKTOTAL, CKMB, CKMBINDEX, TROPONINI in the last 168 hours. BNP: BNP (last 3 results) Recent Labs    07/29/20 0433  BNP 74.0    ProBNP (last 3 results) No results for input(s): PROBNP in the last 8760 hours.  CBG: No results for input(s): GLUCAP in the last 168 hours.     Signed:  Briant Cedar, MD [facility] 07/30/2020, 4:42 PM

## 2020-07-30 NOTE — Evaluation (Signed)
Physical Therapy Evaluation Patient Details Name: Roberta [name] MRN: [id number] DOB: [birth year] Today's Date: 07/30/2020   History of Present Illness  Patient is an 87 year old female hx of anemia, DJD, HL who presented after a fall. + for R superior and inferior pubic rami fractures  Clinical Impression  Pt admitted with above diagnosis.  Pt currently with functional limitations due to the deficits listed below (see PT Problem List). Pt will benefit from skilled PT to increase their independence and safety with mobility to allow discharge to the venue listed below.  Pt ambulated in hallway and with improved steadiness with RW however unsteady if removing a hand from RW grip.  Discussed using walker with both hands at home (utilize walker bag if needed to carry items) and HHPT.  Pt agreeable to both.       Follow Up Recommendations Home health PT;Supervision - Intermittent    Equipment Recommendations  None recommended by PT    Recommendations for Other Services       Precautions / Restrictions Precautions Precautions: Fall Precaution Comments: reports 3 falls this year Restrictions Other Position/Activity Restrictions: WBAT      Mobility  Bed Mobility Overal bed mobility: Modified Independent             General bed mobility comments: pt in recliner on arrival    Transfers Overall transfer level: Needs assistance Equipment used: Rolling walker (2 wheeled) Transfers: Sit to/from Stand Sit to Stand: Min guard         General transfer comment: increased time, good hand positioning  Ambulation/Gait Ambulation/Gait assistance: Min guard Gait Distance (Feet): 80 Feet Assistive device: Rolling walker (2 wheeled) Gait Pattern/deviations: Step-through pattern;Decreased stride length;Trunk flexed Gait velocity: decreased   General Gait Details: cues for staying within RW and posture  Stairs            Wheelchair Mobility    Modified Rankin (Stroke  Patients Only)       Balance Overall balance assessment: History of Falls Sitting-balance support: Feet supported Sitting balance-Leahy Scale: Good     Standing balance support: Bilateral upper extremity supported Standing balance-Leahy Scale: Poor Standing balance comment: reliant on UE support                             Pertinent Vitals/Pain Pain Assessment: Faces Faces Pain Scale: No hurt Pain Location: denies pain Pain Descriptors / Indicators: Sore Pain Intervention(s): Repositioned;Monitored during session    Home Living Family/patient expects to be discharged to:: Private residence (senior apartment) Living Arrangements: Alone Available Help at Discharge: Family;Available PRN/intermittently Type of Home: Independent living facility Home Access: Level entry     Home Layout: One level Home Equipment: Walker - 4 wheels;Shower seat;Grab bars - tub/shower;Walker - 2 wheels      Prior Function Level of Independence: Independent with assistive device(s)         Comments: sometimes uses cane, fall prior to admission she states she was going to get her cane while on the phone     Hand Dominance   Dominant Hand: Right    Extremity/Trunk Assessment   Upper Extremity Assessment Upper Extremity Assessment: Overall WFL for tasks assessed    Lower Extremity Assessment Lower Extremity Assessment: Generalized weakness    Cervical / Trunk Assessment Cervical / Trunk Assessment: Kyphotic;Other exceptions Cervical / Trunk Exceptions: forward head posture  Communication   Communication: No difficulties  Cognition Arousal/Alertness: Awake/alert Behavior During Therapy:  WFL for tasks assessed/performed Overall Cognitive Status: Within Functional Limits for tasks assessed                                        General Comments      Exercises     Assessment/Plan    PT Assessment Patient needs continued PT services  PT Problem  List Decreased strength;Decreased mobility;Decreased activity tolerance;Decreased balance;Decreased knowledge of use of DME;Pain       PT Treatment Interventions Gait training;DME instruction;Therapeutic exercise;Balance training;Functional mobility training;Therapeutic activities;Patient/family education    PT Goals (Current goals can be found in the Care Plan section)  Acute Rehab PT Goals Patient Stated Goal: home to my apartment PT Goal Formulation: With patient/family Time For Goal Achievement: 08/12/20 Potential to Achieve Goals: Good    Frequency Min 3X/week   Barriers to discharge        Co-evaluation               AM-PAC PT "6 Clicks" Mobility  Outcome Measure Help needed turning from your back to your side while in a flat bed without using bedrails?: A Little Help needed moving from lying on your back to sitting on the side of a flat bed without using bedrails?: A Little Help needed moving to and from a bed to a chair (including a wheelchair)?: A Little Help needed standing up from a chair using your arms (e.g., wheelchair or bedside chair)?: A Little Help needed to walk in hospital room?: A Little Help needed climbing 3-5 steps with a railing? : A Little 6 Click Score: 18    End of Session Equipment Utilized During Treatment: Gait belt Activity Tolerance: Patient tolerated treatment well Patient left: in chair;with call bell/phone within reach;with chair alarm set;with family/visitor present Nurse Communication: Mobility status PT Visit Diagnosis: Difficulty in walking, not elsewhere classified (R26.2)    Time: 6294-7654 PT Time Calculation (min) (ACUTE ONLY): 21 min   Charges:   PT Evaluation $PT Eval Low Complexity: 1 Low         Kati PT, DPT Acute Rehabilitation Services Pager: [phone] Office: [phone]  Maida Sale E 07/30/2020, 10:46 AM

## 2020-07-30 NOTE — Progress Notes (Signed)
Patient and family member were given discharge instructions, and all questions were answered. Patient was stable for discharge and was taken to the main exit by wheelchair. 

## 2020-07-30 NOTE — Evaluation (Signed)
Occupational Therapy Evaluation Patient Details Name: Roberta [name] MRN: [id number] DOB: May 15, 1929 Today's Date: 07/30/2020    History of Present Illness Patient is an 87 year old female hx of anemia, DJD, HL who presented after a fall. + for R superior and inferior pubic rami fractures   Clinical Impression   Patient lives in a senior living apartment with level entry. At baseline she is independent with basic self care, cleaning, laundry, and cooking. Has family in area that helps with groceries. State she still drives but won't be "until I'm told I can." Currently patient overall min G assist for safety with functional ambulation and transfers. Patient reports some pain in legs/pelvis area "I just need to take my time." Provide verbal cues for safety to keep legs within frame of walker. Patient able to don socks seated EOB with increased time. Per patient and her daughter, report patient's gait is close to her baseline. Recommend continued acute OT services to maximize patient safety, endurance in order facilitate D/C home.    Follow Up Recommendations  Home health OT;Supervision - Intermittent    Equipment Recommendations  None recommended by OT       Precautions / Restrictions Precautions Precautions: Fall Precaution Comments: reports 3 falls this year, one from "blacking out," fall prior to this one "I don't know what happened" Restrictions Other Position/Activity Restrictions: WBAT      Mobility Bed Mobility Overal bed mobility: Modified Independent             General bed mobility comments: increased time, bed height elevated    Transfers Overall transfer level: Needs assistance Equipment used: Rolling walker (2 wheeled) Transfers: Sit to/from Stand Sit to Stand: Min guard         General transfer comment: does not need physical assistance to rise just increased time, cues for safety to keep LEs within frame of walker. demonstrates good safey pushing up from  bed to stand and reaching back to sit in chair    Balance Overall balance assessment: History of Falls;Needs assistance Sitting-balance support: Feet supported Sitting balance-Leahy Scale: Good     Standing balance support: Bilateral upper extremity supported Standing balance-Leahy Scale: Poor Standing balance comment: reliant on UE support                           ADL either performed or assessed with clinical judgement   ADL Overall ADL's : Needs assistance/impaired Eating/Feeding: Independent;Sitting   Grooming: Set up;Sitting   Upper Body Bathing: Set up;Sitting   Lower Body Bathing: Min guard;Sitting/lateral leans;Sit to/from stand   Upper Body Dressing : Set up;Sitting   Lower Body Dressing: Set up;Min guard;Sitting/lateral leans;Sit to/from stand Lower Body Dressing Details (indicate cue type and reason): seated with increased time patient able to don her socks, min G in standing for safety Toilet Transfer: Min guard;Cueing for safety;Ambulation;RW Toilet Transfer Details (indicate cue type and reason): simulated with functional ambulation in room then chair transfer, needing min cues for safety to keep LEs within frame of walker. Both patient and her DTR state her gait is at her baseline. min G for safety Toileting- Clothing Manipulation and Hygiene: Min guard;Sitting/lateral lean;Sit to/from stand       Functional mobility during ADLs: Min guard;Rolling walker;Cueing for safety General ADL Comments: patient appears close to her baseline, decreased activity tolerance due to pain "I need to take my time."  Pertinent Vitals/Pain Pain Assessment: Faces Faces Pain Scale: Hurts little more Pain Location: pelvis Pain Descriptors / Indicators: Sore Pain Intervention(s): Monitored during session;Premedicated before session     Hand Dominance Right   Extremity/Trunk Assessment Upper Extremity Assessment Upper Extremity Assessment:  Overall WFL for tasks assessed   Lower Extremity Assessment Lower Extremity Assessment: Defer to PT evaluation       Communication Communication Communication: No difficulties   Cognition Arousal/Alertness: Awake/alert Behavior During Therapy: WFL for tasks assessed/performed Overall Cognitive Status: Within Functional Limits for tasks assessed                                                Home Living Family/patient expects to be discharged to:: Private residence (senior apartment) Living Arrangements: Alone Available Help at Discharge: Family;Available PRN/intermittently Type of Home: Apartment Home Access: Level entry     Home Layout: One level     Bathroom Shower/Tub: Chief Strategy Officer: Standard     Home Equipment: Environmental consultant - 4 wheels;Shower seat;Grab bars - tub/shower;Walker - 2 wheels          Prior Functioning/Environment Level of Independence: Independent with assistive device(s)                 OT Problem List: Pain;Decreased activity tolerance;Decreased safety awareness      OT Treatment/Interventions: Self-care/ADL training;DME and/or AE instruction;Therapeutic activities;Balance training;Patient/family education    OT Goals(Current goals can be found in the care plan section) Acute Rehab OT Goals Patient Stated Goal: home to my apartment OT Goal Formulation: With patient Time For Goal Achievement: 08/13/20 Potential to Achieve Goals: Good  OT Frequency: Min 2X/week    AM-PAC OT "6 Clicks" Daily Activity     Outcome Measure Help from another person eating meals?: None Help from another person taking care of personal grooming?: A Little Help from another person toileting, which includes using toliet, bedpan, or urinal?: A Little Help from another person bathing (including washing, rinsing, drying)?: A Little Help from another person to put on and taking off regular upper body clothing?: A Little Help from  another person to put on and taking off regular lower body clothing?: A Little 6 Click Score: 19   End of Session Equipment Utilized During Treatment: Rolling walker Nurse Communication: Mobility status  Activity Tolerance: Patient tolerated treatment well Patient left: in chair;with call bell/phone within reach;with chair alarm set;with family/visitor present  OT Visit Diagnosis: Pain;History of falling (Z91.81);Repeated falls (R29.6) Pain - part of body:  (pelvis)                Time: 1638-4665 OT Time Calculation (min): 31 min Charges:  OT General Charges $OT Visit: 1 Visit OT Evaluation $OT Eval Low Complexity: 1 Low OT Treatments $Self Care/Home Management : 8-22 mins  Marlyce Huge OT OT pager: [phone]  Carmelia Roller 07/30/2020, 10:15 AM

## 2020-08-01 DIAGNOSIS — Z9181 History of falling: Secondary | ICD-10-CM | POA: Diagnosis not present

## 2020-08-01 DIAGNOSIS — M80051D Age-related osteoporosis with current pathological fracture, right femur, subsequent encounter for fracture with routine healing: Secondary | ICD-10-CM | POA: Diagnosis not present

## 2020-08-01 DIAGNOSIS — I872 Venous insufficiency (chronic) (peripheral): Secondary | ICD-10-CM | POA: Diagnosis not present

## 2020-08-01 DIAGNOSIS — W010XXD Fall on same level from slipping, tripping and stumbling without subsequent striking against object, subsequent encounter: Secondary | ICD-10-CM | POA: Diagnosis not present

## 2020-08-01 DIAGNOSIS — D509 Iron deficiency anemia, unspecified: Secondary | ICD-10-CM | POA: Diagnosis not present

## 2020-08-01 DIAGNOSIS — I5032 Chronic diastolic (congestive) heart failure: Secondary | ICD-10-CM | POA: Diagnosis not present

## 2020-08-01 DIAGNOSIS — H409 Unspecified glaucoma: Secondary | ICD-10-CM | POA: Diagnosis not present

## 2020-08-01 DIAGNOSIS — E213 Hyperparathyroidism, unspecified: Secondary | ICD-10-CM | POA: Diagnosis not present

## 2020-08-01 DIAGNOSIS — M5090 Cervical disc disorder, unspecified, unspecified cervical region: Secondary | ICD-10-CM | POA: Diagnosis not present

## 2020-08-01 DIAGNOSIS — M1990 Unspecified osteoarthritis, unspecified site: Secondary | ICD-10-CM | POA: Diagnosis not present

## 2020-08-01 DIAGNOSIS — E7800 Pure hypercholesterolemia, unspecified: Secondary | ICD-10-CM | POA: Diagnosis not present

## 2020-08-01 DIAGNOSIS — E43 Unspecified severe protein-calorie malnutrition: Secondary | ICD-10-CM | POA: Diagnosis not present

## 2020-08-09 DIAGNOSIS — Z79899 Other long term (current) drug therapy: Secondary | ICD-10-CM | POA: Diagnosis not present

## 2020-08-09 DIAGNOSIS — M810 Age-related osteoporosis without current pathological fracture: Secondary | ICD-10-CM | POA: Diagnosis not present

## 2020-08-09 DIAGNOSIS — S32591D Other specified fracture of right pubis, subsequent encounter for fracture with routine healing: Secondary | ICD-10-CM | POA: Diagnosis not present

## 2020-08-09 DIAGNOSIS — I5033 Acute on chronic diastolic (congestive) heart failure: Secondary | ICD-10-CM | POA: Diagnosis not present

## 2020-08-09 DIAGNOSIS — E43 Unspecified severe protein-calorie malnutrition: Secondary | ICD-10-CM | POA: Diagnosis not present

## 2020-08-13 DIAGNOSIS — S32592A Other specified fracture of left pubis, initial encounter for closed fracture: Secondary | ICD-10-CM | POA: Diagnosis not present

## 2020-10-18 DIAGNOSIS — E7800 Pure hypercholesterolemia, unspecified: Secondary | ICD-10-CM | POA: Diagnosis not present

## 2020-10-18 DIAGNOSIS — L858 Other specified epidermal thickening: Secondary | ICD-10-CM | POA: Diagnosis not present

## 2020-10-18 DIAGNOSIS — Z1389 Encounter for screening for other disorder: Secondary | ICD-10-CM | POA: Diagnosis not present

## 2020-10-18 DIAGNOSIS — E213 Hyperparathyroidism, unspecified: Secondary | ICD-10-CM | POA: Diagnosis not present

## 2020-10-18 DIAGNOSIS — E441 Mild protein-calorie malnutrition: Secondary | ICD-10-CM | POA: Diagnosis not present

## 2020-10-18 DIAGNOSIS — Z79899 Other long term (current) drug therapy: Secondary | ICD-10-CM | POA: Diagnosis not present

## 2020-10-18 DIAGNOSIS — M810 Age-related osteoporosis without current pathological fracture: Secondary | ICD-10-CM | POA: Diagnosis not present

## 2020-10-18 DIAGNOSIS — I5030 Unspecified diastolic (congestive) heart failure: Secondary | ICD-10-CM | POA: Diagnosis not present

## 2020-10-18 DIAGNOSIS — Z0000 Encounter for general adult medical examination without abnormal findings: Secondary | ICD-10-CM | POA: Diagnosis not present

## 2020-11-25 DIAGNOSIS — M810 Age-related osteoporosis without current pathological fracture: Secondary | ICD-10-CM | POA: Diagnosis not present

## 2021-01-10 DIAGNOSIS — H409 Unspecified glaucoma: Secondary | ICD-10-CM | POA: Diagnosis not present

## 2021-01-10 DIAGNOSIS — D508 Other iron deficiency anemias: Secondary | ICD-10-CM | POA: Diagnosis not present

## 2021-01-10 DIAGNOSIS — I5030 Unspecified diastolic (congestive) heart failure: Secondary | ICD-10-CM | POA: Diagnosis not present

## 2021-01-10 DIAGNOSIS — M810 Age-related osteoporosis without current pathological fracture: Secondary | ICD-10-CM | POA: Diagnosis not present

## 2021-01-10 DIAGNOSIS — E7800 Pure hypercholesterolemia, unspecified: Secondary | ICD-10-CM | POA: Diagnosis not present

## 2021-02-16 DIAGNOSIS — H35373 Puckering of macula, bilateral: Secondary | ICD-10-CM | POA: Diagnosis not present

## 2021-04-13 DIAGNOSIS — I5030 Unspecified diastolic (congestive) heart failure: Secondary | ICD-10-CM | POA: Diagnosis not present

## 2021-04-13 DIAGNOSIS — M25559 Pain in unspecified hip: Secondary | ICD-10-CM | POA: Diagnosis not present

## 2021-04-22 DIAGNOSIS — E7800 Pure hypercholesterolemia, unspecified: Secondary | ICD-10-CM | POA: Diagnosis not present

## 2021-04-22 DIAGNOSIS — H409 Unspecified glaucoma: Secondary | ICD-10-CM | POA: Diagnosis not present

## 2021-04-22 DIAGNOSIS — M810 Age-related osteoporosis without current pathological fracture: Secondary | ICD-10-CM | POA: Diagnosis not present

## 2021-04-22 DIAGNOSIS — I5030 Unspecified diastolic (congestive) heart failure: Secondary | ICD-10-CM | POA: Diagnosis not present

## 2021-05-31 DIAGNOSIS — M810 Age-related osteoporosis without current pathological fracture: Secondary | ICD-10-CM | POA: Diagnosis not present

## 2021-08-29 DIAGNOSIS — R1032 Left lower quadrant pain: Secondary | ICD-10-CM | POA: Diagnosis not present

## 2021-11-29 DIAGNOSIS — M810 Age-related osteoporosis without current pathological fracture: Secondary | ICD-10-CM | POA: Diagnosis not present

## 2021-12-13 DIAGNOSIS — G3184 Mild cognitive impairment, so stated: Secondary | ICD-10-CM | POA: Diagnosis not present

## 2021-12-13 DIAGNOSIS — Z7189 Other specified counseling: Secondary | ICD-10-CM | POA: Diagnosis not present

## 2021-12-13 DIAGNOSIS — Z0000 Encounter for general adult medical examination without abnormal findings: Secondary | ICD-10-CM | POA: Diagnosis not present

## 2021-12-13 DIAGNOSIS — Z1331 Encounter for screening for depression: Secondary | ICD-10-CM | POA: Diagnosis not present

## 2021-12-13 DIAGNOSIS — I5030 Unspecified diastolic (congestive) heart failure: Secondary | ICD-10-CM | POA: Diagnosis not present

## 2021-12-13 DIAGNOSIS — E7800 Pure hypercholesterolemia, unspecified: Secondary | ICD-10-CM | POA: Diagnosis not present

## 2021-12-13 DIAGNOSIS — H409 Unspecified glaucoma: Secondary | ICD-10-CM | POA: Diagnosis not present

## 2021-12-13 DIAGNOSIS — Z79899 Other long term (current) drug therapy: Secondary | ICD-10-CM | POA: Diagnosis not present

## 2021-12-13 DIAGNOSIS — Z23 Encounter for immunization: Secondary | ICD-10-CM | POA: Diagnosis not present

## 2021-12-13 DIAGNOSIS — E213 Hyperparathyroidism, unspecified: Secondary | ICD-10-CM | POA: Diagnosis not present

## 2022-02-20 DIAGNOSIS — H35373 Puckering of macula, bilateral: Secondary | ICD-10-CM | POA: Diagnosis not present

## 2022-06-15 IMAGING — CR DG CHEST 1V
1 series · 1 of 1 positions shown · non-contrast
Comparison: 08/21/2007

CLINICAL DATA: Left knee pain after a fall.

EXAM:
CHEST  1 VIEW

[x chest ap]
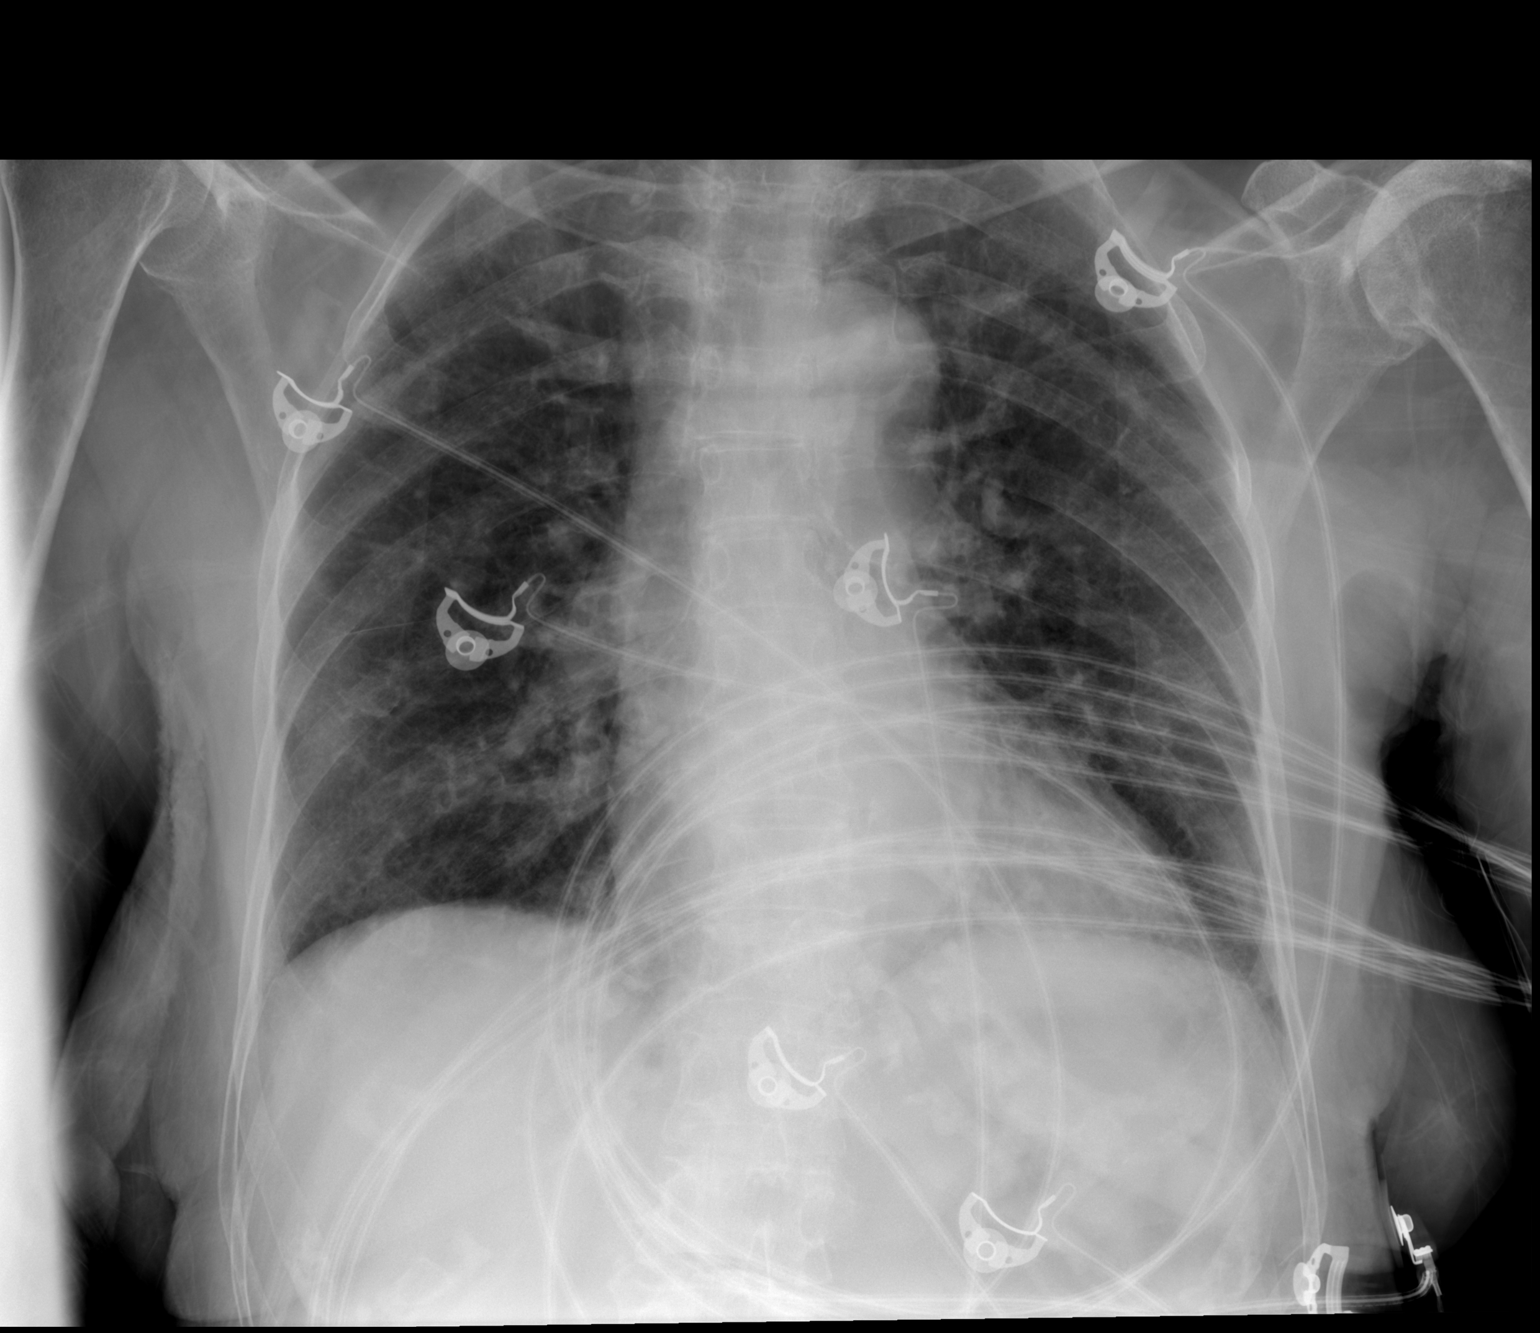

[1 of 1 positions shown; findings below may reference images not displayed]

FINDINGS: The heart size and mediastinal contours are within normal limits.
Both lungs are clear. The visualized skeletal structures are
unremarkable.
IMPRESSION: No active disease.

## 2022-06-15 IMAGING — CT CT HEAD W/O CM
3 series · 15 of 47 positions shown, 18 images · non-contrast
Comparison: None.

CLINICAL DATA: 89-year-old female with facial trauma.

EXAM:
CT HEAD WITHOUT CONTRAST
CT CERVICAL SPINE WITHOUT CONTRAST
TECHNIQUE: Multidetector CT imaging of the head and cervical spine was
performed following the standard protocol without intravenous
contrast. Multiplanar CT image reconstructions of the cervical spine
were also generated.

[Series 3: head wo · axial · 0.44mm/px · z∈[+1460,+1595]mm · 9 of 33 slices shown, 12 images]
[im 3/33  brain]
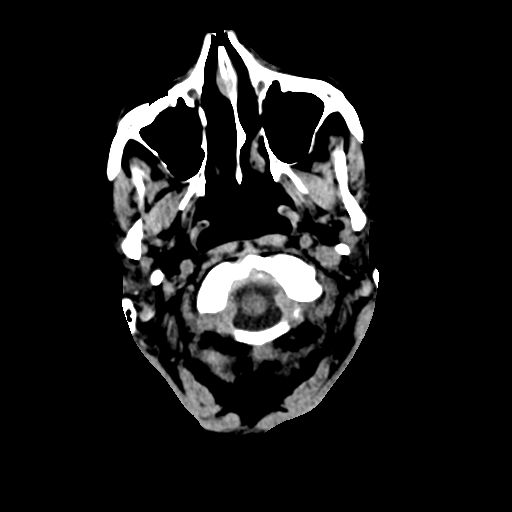
[im 3/33  bone]
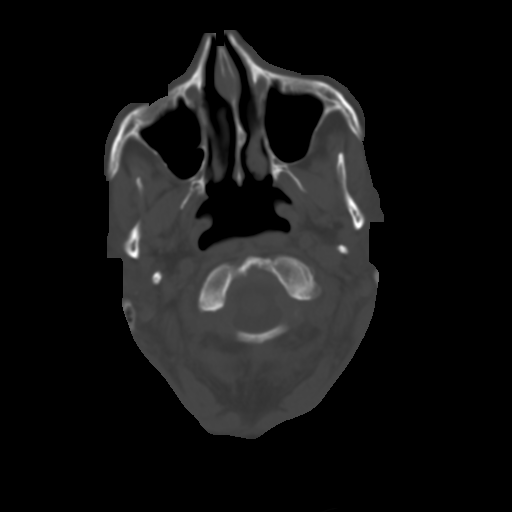
[im 6/33  brain]
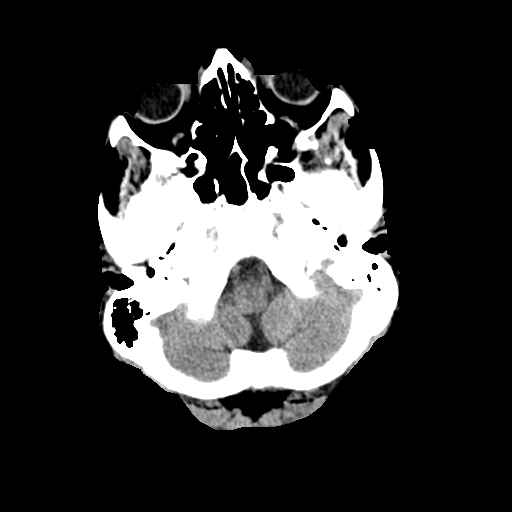
[im 9/33  brain]
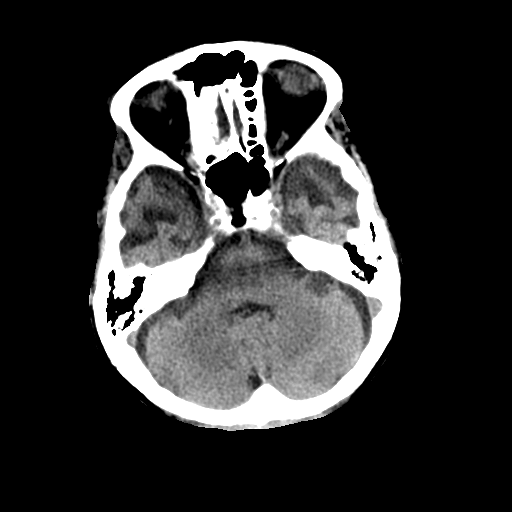
[im 13/33  brain]
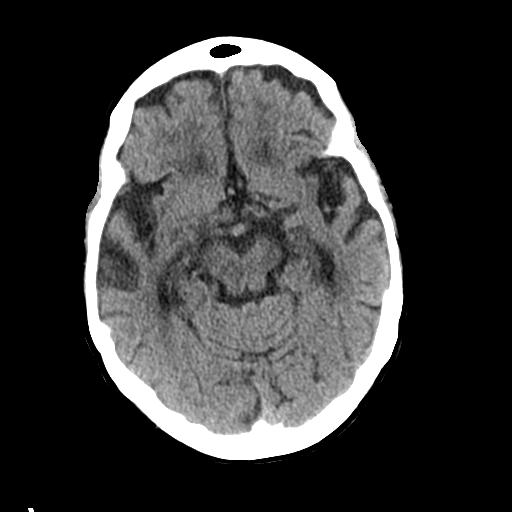
[im 17/33  brain]
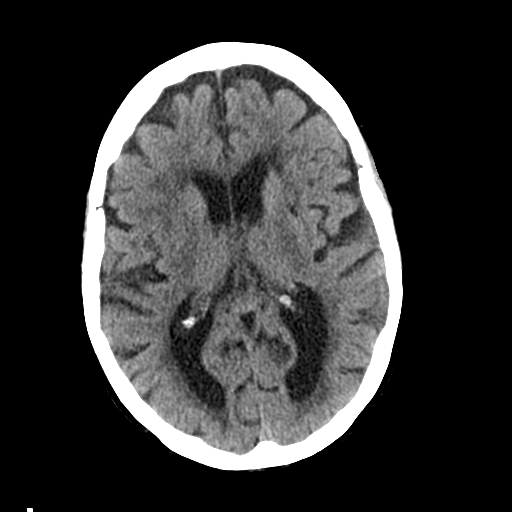
[im 17/33  bone]
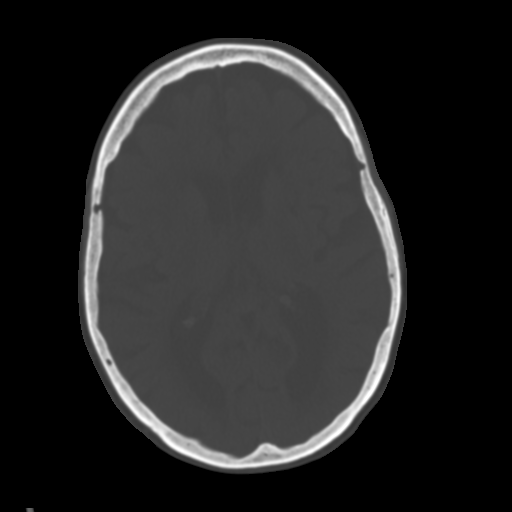
[im 20/33  brain]
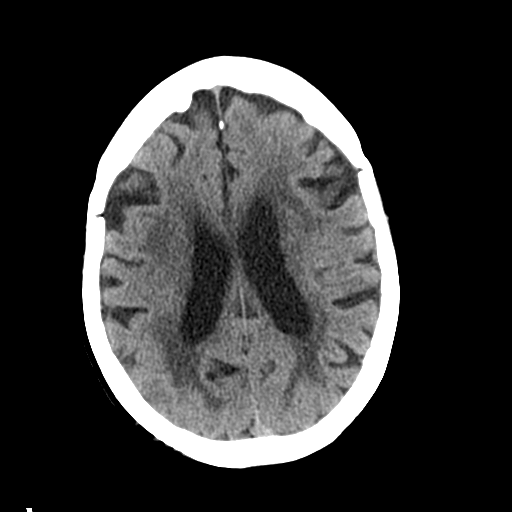
[im 24/33  brain]
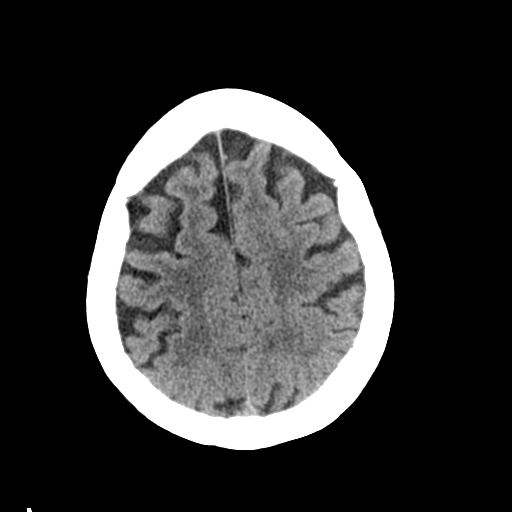
[im 27/33  brain]
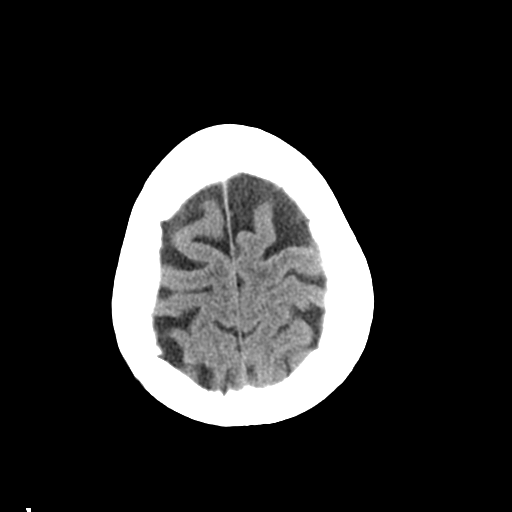
[im 30/33  brain]
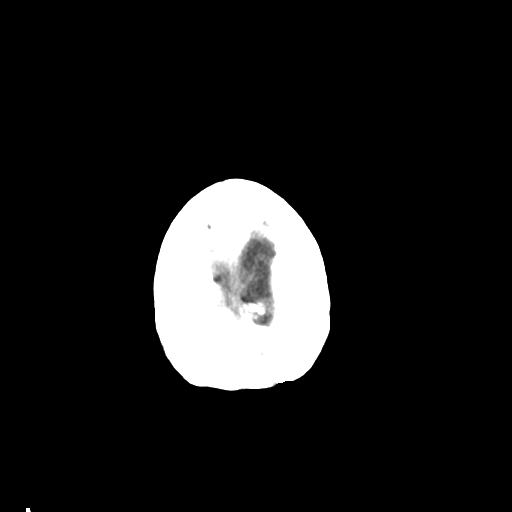
[im 30/33  bone]
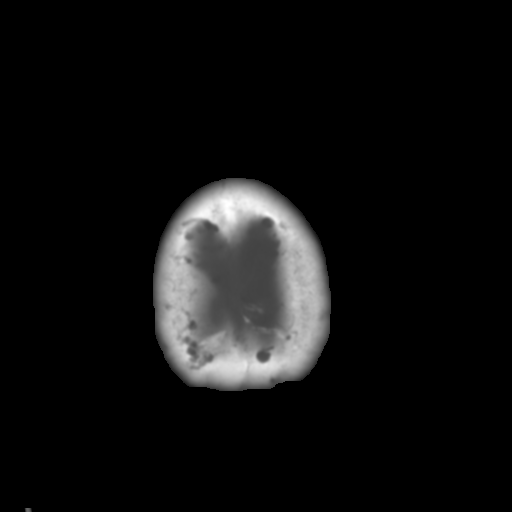

[Series 5: coronal soft tissue · coronal · 0.35mm/px · 3 of 76 slices shown]
[im 26/76  brain]
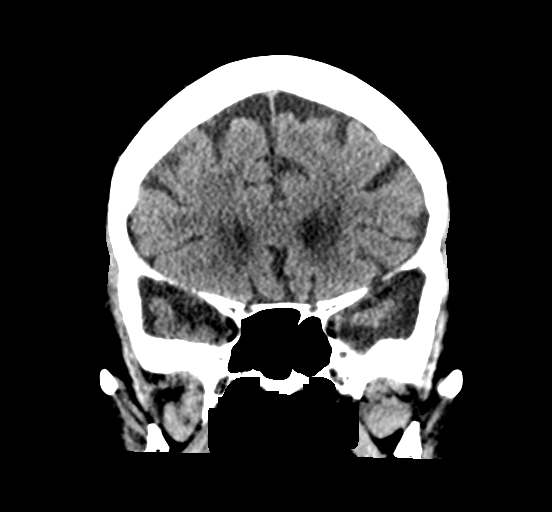
[im 34/76  brain]
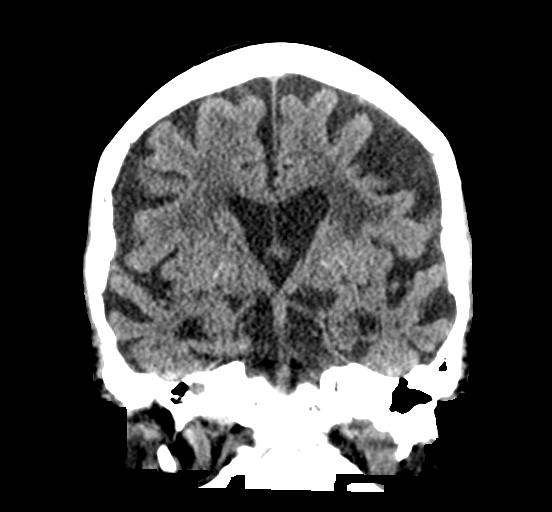
[im 42/76  brain]
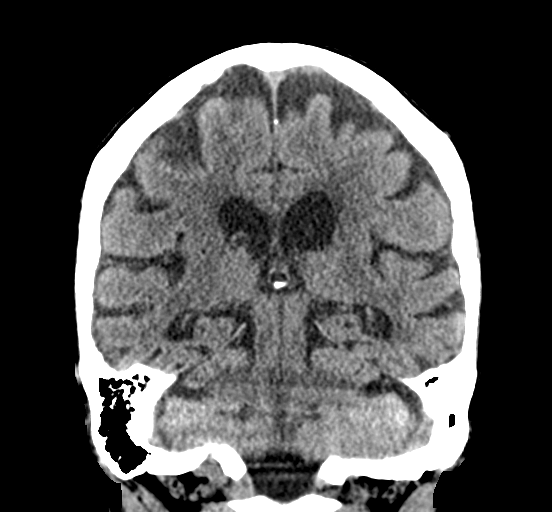

[Series 6: sagittal soft tissue · sagittal · 0.34mm/px · 3 of 57 slices shown]
[im 19/57  brain]
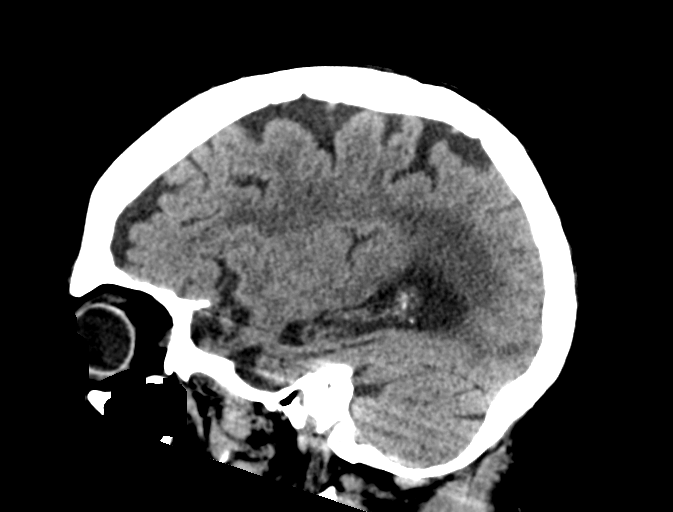
[im 29/57  brain]
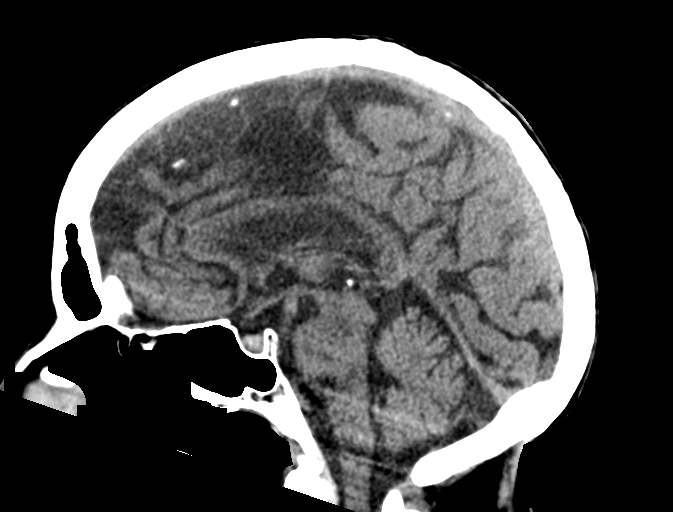
[im 38/57  brain]
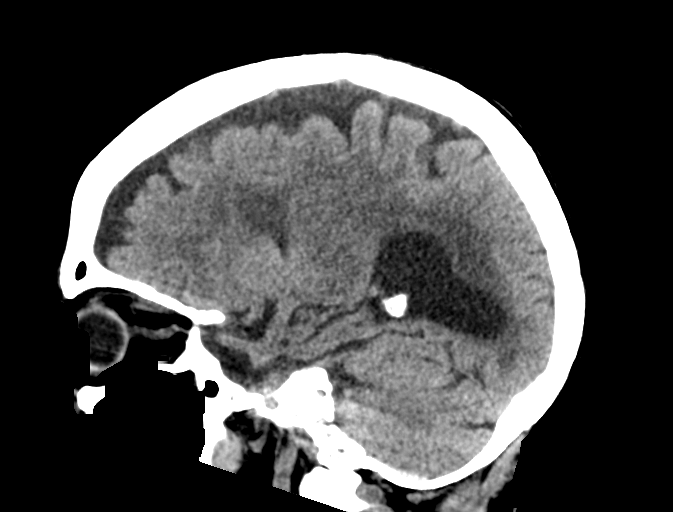

[15 of 47 positions shown; findings below may reference images not displayed]

FINDINGS: CT HEAD FINDINGS

Brain: There is moderate age-related atrophy and chronic
microvascular ischemic changes. There is no acute intracranial
hemorrhage. No mass effect or midline shift no extra-axial fluid
collection.

Vascular: No hyperdense vessel or unexpected calcification.

Skull: Normal. Negative for fracture or focal lesion.

Sinuses/Orbits: No acute finding.

Other: None

CT CERVICAL SPINE FINDINGS

Alignment: No acute subluxation.

Skull base and vertebrae: No acute fracture.

Soft tissues and spinal canal: No prevertebral fluid or swelling. No
visible canal hematoma.

Disc levels:  Multilevel degenerative changes. No acute findings.

Upper chest: Negative.

Other: Heterogeneous thyroid gland with several nodules. In the
setting of significant comorbidities or limited life expectancy, no
follow-up recommended (ref: [HOSPITAL]. [DATE]):
IMPRESSION: 1. No acute intracranial pathology. Moderate age-related atrophy and
chronic microvascular ischemic changes.
2. No acute/traumatic cervical spine pathology.

## 2023-06-04 DIAGNOSIS — R600 Localized edema: Secondary | ICD-10-CM | POA: Diagnosis not present

## 2023-06-04 DIAGNOSIS — I5032 Chronic diastolic (congestive) heart failure: Secondary | ICD-10-CM | POA: Diagnosis not present

## 2023-06-06 DIAGNOSIS — R2681 Unsteadiness on feet: Secondary | ICD-10-CM | POA: Diagnosis not present

## 2023-06-06 DIAGNOSIS — R2689 Other abnormalities of gait and mobility: Secondary | ICD-10-CM | POA: Diagnosis not present

## 2023-06-06 DIAGNOSIS — M810 Age-related osteoporosis without current pathological fracture: Secondary | ICD-10-CM | POA: Diagnosis not present

## 2023-06-13 DIAGNOSIS — R2681 Unsteadiness on feet: Secondary | ICD-10-CM | POA: Diagnosis not present

## 2023-06-13 DIAGNOSIS — M810 Age-related osteoporosis without current pathological fracture: Secondary | ICD-10-CM | POA: Diagnosis not present

## 2023-06-13 DIAGNOSIS — R2689 Other abnormalities of gait and mobility: Secondary | ICD-10-CM | POA: Diagnosis not present

## 2023-06-19 DIAGNOSIS — R2689 Other abnormalities of gait and mobility: Secondary | ICD-10-CM | POA: Diagnosis not present

## 2023-06-19 DIAGNOSIS — M810 Age-related osteoporosis without current pathological fracture: Secondary | ICD-10-CM | POA: Diagnosis not present

## 2023-06-19 DIAGNOSIS — R2681 Unsteadiness on feet: Secondary | ICD-10-CM | POA: Diagnosis not present

## 2023-06-19 DIAGNOSIS — N179 Acute kidney failure, unspecified: Secondary | ICD-10-CM | POA: Diagnosis not present

## 2023-06-20 ENCOUNTER — Ambulatory Visit: Admitting: Podiatry

## 2023-06-20 DIAGNOSIS — M79674 Pain in right toe(s): Secondary | ICD-10-CM

## 2023-06-20 DIAGNOSIS — M79675 Pain in left toe(s): Secondary | ICD-10-CM

## 2023-06-20 DIAGNOSIS — B351 Tinea unguium: Secondary | ICD-10-CM

## 2023-06-20 NOTE — Progress Notes (Signed)
  Subjective:  Patient ID: Roberta [name], female    DOB: [birth year],   MRN: [id number]  No chief complaint on file.   89 y.o. female presents for concern of thickened elongated and painful nails that are difficult to trim. Requesting to have them trimmed today. Unable to trim herself.   PCP:  Thana Ates, MD    . Denies any other pedal complaints. Denies n/v/f/c.   Past Medical History:  Diagnosis Date   Anemia    Cervical disc disease    Glaucoma    Hypercalcemia    Hypercholesteremia    Osteoporosis 2/15   Started Fosamax   Thyroid disease    Hyperparathyroid   Venous insufficiency     Objective:  Physical Exam: Vascular: DP/PT pulses 2/4 bilateral. CFT <3 seconds. Xerosis and scaling and edema noted to bilateral lower extremities.  Skin. No lacerations or abrasions bilateral feet. Nails 1-5 bilateral dystrophic gryphotics with subungual derbis and very elongated.  Musculoskeletal: MMT 5/5 bilateral lower extremities in DF, PF, Inversion and Eversion. Deceased ROM in DF of ankle joint.  Neurological: Sensation intact to light touch.   Assessment:   1. Pain due to onychomycosis of toenails of both feet      Plan:  Patient was evaluated and treated and all questions answered. -Mechanically debrided all nails 1-5 bilateral using sterile nail nipper and filed with dremel without incident  -Answered all patient questions -Patient to return  in 3 months for at risk foot care -Patient advised to call the office if any problems or questions arise in the meantime.    Louann Sjogren, DPM

## 2023-06-26 DIAGNOSIS — M810 Age-related osteoporosis without current pathological fracture: Secondary | ICD-10-CM | POA: Diagnosis not present

## 2023-06-26 DIAGNOSIS — R2689 Other abnormalities of gait and mobility: Secondary | ICD-10-CM | POA: Diagnosis not present

## 2023-06-26 DIAGNOSIS — R2681 Unsteadiness on feet: Secondary | ICD-10-CM | POA: Diagnosis not present

## 2023-06-27 DIAGNOSIS — M810 Age-related osteoporosis without current pathological fracture: Secondary | ICD-10-CM | POA: Diagnosis not present

## 2023-07-04 DIAGNOSIS — R2681 Unsteadiness on feet: Secondary | ICD-10-CM | POA: Diagnosis not present

## 2023-07-04 DIAGNOSIS — R2689 Other abnormalities of gait and mobility: Secondary | ICD-10-CM | POA: Diagnosis not present

## 2023-07-04 DIAGNOSIS — M810 Age-related osteoporosis without current pathological fracture: Secondary | ICD-10-CM | POA: Diagnosis not present

## 2023-07-11 DIAGNOSIS — M810 Age-related osteoporosis without current pathological fracture: Secondary | ICD-10-CM | POA: Diagnosis not present

## 2023-07-11 DIAGNOSIS — R2681 Unsteadiness on feet: Secondary | ICD-10-CM | POA: Diagnosis not present

## 2023-07-11 DIAGNOSIS — R2689 Other abnormalities of gait and mobility: Secondary | ICD-10-CM | POA: Diagnosis not present

## 2023-07-17 DIAGNOSIS — R2681 Unsteadiness on feet: Secondary | ICD-10-CM | POA: Diagnosis not present

## 2023-07-17 DIAGNOSIS — M810 Age-related osteoporosis without current pathological fracture: Secondary | ICD-10-CM | POA: Diagnosis not present

## 2023-07-17 DIAGNOSIS — R2689 Other abnormalities of gait and mobility: Secondary | ICD-10-CM | POA: Diagnosis not present

## 2023-07-23 DIAGNOSIS — R2689 Other abnormalities of gait and mobility: Secondary | ICD-10-CM | POA: Diagnosis not present

## 2023-07-23 DIAGNOSIS — M810 Age-related osteoporosis without current pathological fracture: Secondary | ICD-10-CM | POA: Diagnosis not present

## 2023-07-23 DIAGNOSIS — R2681 Unsteadiness on feet: Secondary | ICD-10-CM | POA: Diagnosis not present

## 2023-07-24 DIAGNOSIS — R29898 Other symptoms and signs involving the musculoskeletal system: Secondary | ICD-10-CM | POA: Diagnosis not present

## 2023-07-24 DIAGNOSIS — I5030 Unspecified diastolic (congestive) heart failure: Secondary | ICD-10-CM | POA: Diagnosis not present

## 2023-07-24 DIAGNOSIS — R600 Localized edema: Secondary | ICD-10-CM | POA: Diagnosis not present

## 2023-08-03 DIAGNOSIS — R2689 Other abnormalities of gait and mobility: Secondary | ICD-10-CM | POA: Diagnosis not present

## 2023-08-03 DIAGNOSIS — M810 Age-related osteoporosis without current pathological fracture: Secondary | ICD-10-CM | POA: Diagnosis not present

## 2023-08-03 DIAGNOSIS — R2681 Unsteadiness on feet: Secondary | ICD-10-CM | POA: Diagnosis not present

## 2023-08-08 DIAGNOSIS — R2681 Unsteadiness on feet: Secondary | ICD-10-CM | POA: Diagnosis not present

## 2023-08-08 DIAGNOSIS — R2689 Other abnormalities of gait and mobility: Secondary | ICD-10-CM | POA: Diagnosis not present

## 2023-08-08 DIAGNOSIS — M810 Age-related osteoporosis without current pathological fracture: Secondary | ICD-10-CM | POA: Diagnosis not present

## 2023-08-15 DIAGNOSIS — R2689 Other abnormalities of gait and mobility: Secondary | ICD-10-CM | POA: Diagnosis not present

## 2023-08-15 DIAGNOSIS — R2681 Unsteadiness on feet: Secondary | ICD-10-CM | POA: Diagnosis not present

## 2023-08-15 DIAGNOSIS — M810 Age-related osteoporosis without current pathological fracture: Secondary | ICD-10-CM | POA: Diagnosis not present

## 2023-08-20 DIAGNOSIS — R2689 Other abnormalities of gait and mobility: Secondary | ICD-10-CM | POA: Diagnosis not present

## 2023-08-20 DIAGNOSIS — R2681 Unsteadiness on feet: Secondary | ICD-10-CM | POA: Diagnosis not present

## 2023-08-20 DIAGNOSIS — M810 Age-related osteoporosis without current pathological fracture: Secondary | ICD-10-CM | POA: Diagnosis not present

## 2023-08-31 DIAGNOSIS — R2681 Unsteadiness on feet: Secondary | ICD-10-CM | POA: Diagnosis not present

## 2023-08-31 DIAGNOSIS — M810 Age-related osteoporosis without current pathological fracture: Secondary | ICD-10-CM | POA: Diagnosis not present

## 2023-08-31 DIAGNOSIS — R2689 Other abnormalities of gait and mobility: Secondary | ICD-10-CM | POA: Diagnosis not present

## 2023-09-04 DIAGNOSIS — S161XXA Strain of muscle, fascia and tendon at neck level, initial encounter: Secondary | ICD-10-CM | POA: Diagnosis not present

## 2023-09-05 DIAGNOSIS — M810 Age-related osteoporosis without current pathological fracture: Secondary | ICD-10-CM | POA: Diagnosis not present

## 2023-09-05 DIAGNOSIS — R2681 Unsteadiness on feet: Secondary | ICD-10-CM | POA: Diagnosis not present

## 2023-09-05 DIAGNOSIS — R2689 Other abnormalities of gait and mobility: Secondary | ICD-10-CM | POA: Diagnosis not present

## 2023-09-12 DIAGNOSIS — R2681 Unsteadiness on feet: Secondary | ICD-10-CM | POA: Diagnosis not present

## 2023-09-12 DIAGNOSIS — M810 Age-related osteoporosis without current pathological fracture: Secondary | ICD-10-CM | POA: Diagnosis not present

## 2023-09-12 DIAGNOSIS — R2689 Other abnormalities of gait and mobility: Secondary | ICD-10-CM | POA: Diagnosis not present

## 2023-09-20 ENCOUNTER — Encounter: Payer: Self-pay | Admitting: Podiatry

## 2023-09-20 ENCOUNTER — Ambulatory Visit: Admitting: Podiatry

## 2023-09-20 DIAGNOSIS — M79674 Pain in right toe(s): Secondary | ICD-10-CM | POA: Diagnosis not present

## 2023-09-20 DIAGNOSIS — B351 Tinea unguium: Secondary | ICD-10-CM

## 2023-09-20 DIAGNOSIS — M79675 Pain in left toe(s): Secondary | ICD-10-CM | POA: Diagnosis not present

## 2023-09-20 NOTE — Progress Notes (Signed)
 This patient presents to the office with chief complaint of long thick painful nails.  Patient says the nails are painful walking and wearing shoes.  This patient is unable to self treat.  This patient is unable to trim her nails since she is unable to reach her nails.  She presents to the office with her daughter. She presents to the office for preventative foot care services.  General Appearance  Alert, conversant and in no acute stress.  Vascular  Dorsalis pedis and posterior tibial  pulses are weakly  palpable  bilaterally.  Capillary return is within normal limits  bilaterally. Temperature is within normal limits  bilaterally. Swelling legs  B/L.  Neurologic  Senn-Weinstein monofilament wire test within normal limits  bilaterally. Muscle power within normal limits bilaterally.  Nails Thick disfigured discolored nails with subungual debris  from hallux to fifth toes bilaterally. No evidence of bacterial infection or drainage bilaterally.  Orthopedic  No limitations of motion  feet .  No crepitus or effusions noted.  No bony pathology or digital deformities noted.  Skin  normotropic skin with no porokeratosis noted bilaterally.  No signs of infections or ulcers noted.     Onychomycosis  Nails  B/L.  Pain in right toes  Pain in left toes  Debridement of nails both feet followed trimming the nails with dremel tool.    RTC 3 months.   Ruffin Cotton DPM

## 2023-09-24 DIAGNOSIS — M810 Age-related osteoporosis without current pathological fracture: Secondary | ICD-10-CM | POA: Diagnosis not present

## 2023-09-24 DIAGNOSIS — R2689 Other abnormalities of gait and mobility: Secondary | ICD-10-CM | POA: Diagnosis not present

## 2023-09-24 DIAGNOSIS — R2681 Unsteadiness on feet: Secondary | ICD-10-CM | POA: Diagnosis not present

## 2023-12-26 ENCOUNTER — Encounter: Payer: Self-pay | Admitting: Podiatry

## 2023-12-26 ENCOUNTER — Ambulatory Visit (INDEPENDENT_AMBULATORY_CARE_PROVIDER_SITE_OTHER): Admitting: Podiatry

## 2023-12-26 DIAGNOSIS — M79674 Pain in right toe(s): Secondary | ICD-10-CM | POA: Diagnosis not present

## 2023-12-26 DIAGNOSIS — M79675 Pain in left toe(s): Secondary | ICD-10-CM

## 2023-12-26 DIAGNOSIS — B351 Tinea unguium: Secondary | ICD-10-CM | POA: Diagnosis not present

## 2023-12-26 NOTE — Progress Notes (Signed)
 This patient presents to the office with chief complaint of long thick painful nails.  Patient says the nails are painful walking and wearing shoes.  This patient is unable to self treat.  This patient is unable to trim her nails since she is unable to reach her nails.  She presents to the office with her daughter. She presents to the office for preventative foot care services.  General Appearance  Alert, conversant and in no acute stress.  Vascular  Dorsalis pedis and posterior tibial  pulses are weakly  palpable  bilaterally.  Capillary return is within normal limits  bilaterally. Temperature is within normal limits  bilaterally. Swelling legs  B/L.  Neurologic  Senn-Weinstein monofilament wire test within normal limits  bilaterally. Muscle power within normal limits bilaterally.  Nails Thick disfigured discolored nails with subungual debris  from hallux to fifth toes bilaterally. No evidence of bacterial infection or drainage bilaterally.  Orthopedic  No limitations of motion  feet .  No crepitus or effusions noted.  No bony pathology or digital deformities noted.  Skin  normotropic skin with no porokeratosis noted bilaterally.  No signs of infections or ulcers noted.     Onychomycosis  Nails  B/L.  Pain in right toes  Pain in left toes  Debridement of nails both feet followed trimming the nails with dremel tool.    RTC 3 months.   Ruffin Cotton DPM

## 2024-01-01 DIAGNOSIS — M810 Age-related osteoporosis without current pathological fracture: Secondary | ICD-10-CM | POA: Diagnosis not present

## 2024-01-22 DIAGNOSIS — E7800 Pure hypercholesterolemia, unspecified: Secondary | ICD-10-CM | POA: Diagnosis not present

## 2024-01-22 DIAGNOSIS — D649 Anemia, unspecified: Secondary | ICD-10-CM | POA: Diagnosis not present

## 2024-01-22 DIAGNOSIS — Z0000 Encounter for general adult medical examination without abnormal findings: Secondary | ICD-10-CM | POA: Diagnosis not present

## 2024-01-22 DIAGNOSIS — M810 Age-related osteoporosis without current pathological fracture: Secondary | ICD-10-CM | POA: Diagnosis not present

## 2024-01-22 DIAGNOSIS — E213 Hyperparathyroidism, unspecified: Secondary | ICD-10-CM | POA: Diagnosis not present

## 2024-01-22 DIAGNOSIS — Z23 Encounter for immunization: Secondary | ICD-10-CM | POA: Diagnosis not present

## 2024-01-22 DIAGNOSIS — E441 Mild protein-calorie malnutrition: Secondary | ICD-10-CM | POA: Diagnosis not present

## 2024-01-22 DIAGNOSIS — Z79899 Other long term (current) drug therapy: Secondary | ICD-10-CM | POA: Diagnosis not present

## 2024-01-22 DIAGNOSIS — I5030 Unspecified diastolic (congestive) heart failure: Secondary | ICD-10-CM | POA: Diagnosis not present

## 2024-01-22 DIAGNOSIS — I872 Venous insufficiency (chronic) (peripheral): Secondary | ICD-10-CM | POA: Diagnosis not present

## 2024-03-24 ENCOUNTER — Ambulatory Visit: Admitting: Podiatry

## 2024-03-24 ENCOUNTER — Telehealth: Payer: Self-pay | Admitting: Podiatry

## 2024-04-03 DEATH — deceased
